# Patient Record
Sex: Female | Born: 1935
Health system: Southern US, Community
[De-identification: ages and names within clinical notes are randomized; demographics above are authoritative.]

## PROBLEM LIST (undated history)

## (undated) DIAGNOSIS — E785 Hyperlipidemia, unspecified: Secondary | ICD-10-CM

## (undated) DIAGNOSIS — J302 Other seasonal allergic rhinitis: Secondary | ICD-10-CM

## (undated) DIAGNOSIS — I4891 Unspecified atrial fibrillation: Secondary | ICD-10-CM

## (undated) DIAGNOSIS — G4700 Insomnia, unspecified: Secondary | ICD-10-CM

## (undated) DIAGNOSIS — E039 Hypothyroidism, unspecified: Secondary | ICD-10-CM

## (undated) DIAGNOSIS — I10 Essential (primary) hypertension: Secondary | ICD-10-CM

---

## 2012-02-28 DIAGNOSIS — J309 Allergic rhinitis, unspecified: Secondary | ICD-10-CM | POA: Diagnosis not present

## 2012-02-28 DIAGNOSIS — Z79899 Other long term (current) drug therapy: Secondary | ICD-10-CM | POA: Diagnosis not present

## 2012-02-28 DIAGNOSIS — E039 Hypothyroidism, unspecified: Secondary | ICD-10-CM | POA: Diagnosis not present

## 2012-02-28 DIAGNOSIS — I4891 Unspecified atrial fibrillation: Secondary | ICD-10-CM | POA: Diagnosis not present

## 2012-08-07 DIAGNOSIS — Z23 Encounter for immunization: Secondary | ICD-10-CM | POA: Diagnosis not present

## 2012-09-02 DIAGNOSIS — H26499 Other secondary cataract, unspecified eye: Secondary | ICD-10-CM | POA: Diagnosis not present

## 2012-09-09 DIAGNOSIS — I10 Essential (primary) hypertension: Secondary | ICD-10-CM | POA: Diagnosis not present

## 2012-09-09 DIAGNOSIS — I4891 Unspecified atrial fibrillation: Secondary | ICD-10-CM | POA: Diagnosis not present

## 2012-09-09 DIAGNOSIS — E782 Mixed hyperlipidemia: Secondary | ICD-10-CM | POA: Diagnosis not present

## 2012-09-09 DIAGNOSIS — Z79899 Other long term (current) drug therapy: Secondary | ICD-10-CM | POA: Diagnosis not present

## 2012-10-07 DIAGNOSIS — I10 Essential (primary) hypertension: Secondary | ICD-10-CM | POA: Diagnosis not present

## 2012-11-07 DIAGNOSIS — I10 Essential (primary) hypertension: Secondary | ICD-10-CM | POA: Diagnosis not present

## 2013-03-03 DIAGNOSIS — E782 Mixed hyperlipidemia: Secondary | ICD-10-CM | POA: Diagnosis not present

## 2013-03-03 DIAGNOSIS — Z0000 Encounter for general adult medical examination without abnormal findings: Secondary | ICD-10-CM | POA: Diagnosis not present

## 2013-03-03 DIAGNOSIS — E7800 Pure hypercholesterolemia, unspecified: Secondary | ICD-10-CM | POA: Diagnosis not present

## 2013-08-17 DIAGNOSIS — Z23 Encounter for immunization: Secondary | ICD-10-CM | POA: Diagnosis not present

## 2013-09-15 DIAGNOSIS — E039 Hypothyroidism, unspecified: Secondary | ICD-10-CM | POA: Diagnosis not present

## 2013-09-15 DIAGNOSIS — G4700 Insomnia, unspecified: Secondary | ICD-10-CM | POA: Diagnosis not present

## 2013-12-30 DIAGNOSIS — H524 Presbyopia: Secondary | ICD-10-CM | POA: Diagnosis not present

## 2013-12-30 DIAGNOSIS — H26499 Other secondary cataract, unspecified eye: Secondary | ICD-10-CM | POA: Diagnosis not present

## 2014-02-09 DIAGNOSIS — I4891 Unspecified atrial fibrillation: Secondary | ICD-10-CM | POA: Diagnosis not present

## 2014-02-09 DIAGNOSIS — E039 Hypothyroidism, unspecified: Secondary | ICD-10-CM | POA: Diagnosis not present

## 2014-02-09 DIAGNOSIS — E782 Mixed hyperlipidemia: Secondary | ICD-10-CM | POA: Diagnosis not present

## 2014-02-09 DIAGNOSIS — G4700 Insomnia, unspecified: Secondary | ICD-10-CM | POA: Diagnosis not present

## 2014-03-11 DIAGNOSIS — I491 Atrial premature depolarization: Secondary | ICD-10-CM | POA: Diagnosis not present

## 2014-03-11 DIAGNOSIS — I471 Supraventricular tachycardia: Secondary | ICD-10-CM | POA: Diagnosis not present

## 2014-03-11 DIAGNOSIS — I452 Bifascicular block: Secondary | ICD-10-CM | POA: Diagnosis not present

## 2014-03-12 DIAGNOSIS — Z0000 Encounter for general adult medical examination without abnormal findings: Secondary | ICD-10-CM | POA: Diagnosis not present

## 2014-03-12 DIAGNOSIS — I10 Essential (primary) hypertension: Secondary | ICD-10-CM | POA: Diagnosis not present

## 2014-04-06 DIAGNOSIS — I4891 Unspecified atrial fibrillation: Secondary | ICD-10-CM | POA: Diagnosis not present

## 2014-04-06 DIAGNOSIS — R55 Syncope and collapse: Secondary | ICD-10-CM | POA: Diagnosis not present

## 2014-04-06 DIAGNOSIS — I471 Supraventricular tachycardia: Secondary | ICD-10-CM | POA: Diagnosis not present

## 2014-04-09 DIAGNOSIS — R55 Syncope and collapse: Secondary | ICD-10-CM | POA: Diagnosis not present

## 2014-04-15 DIAGNOSIS — I471 Supraventricular tachycardia: Secondary | ICD-10-CM | POA: Diagnosis not present

## 2014-04-15 DIAGNOSIS — I4891 Unspecified atrial fibrillation: Secondary | ICD-10-CM | POA: Diagnosis not present

## 2014-04-22 DIAGNOSIS — I4891 Unspecified atrial fibrillation: Secondary | ICD-10-CM | POA: Diagnosis not present

## 2014-06-15 DIAGNOSIS — Z79899 Other long term (current) drug therapy: Secondary | ICD-10-CM | POA: Diagnosis not present

## 2014-06-15 DIAGNOSIS — E039 Hypothyroidism, unspecified: Secondary | ICD-10-CM | POA: Diagnosis not present

## 2014-06-15 DIAGNOSIS — I10 Essential (primary) hypertension: Secondary | ICD-10-CM | POA: Diagnosis not present

## 2014-06-15 DIAGNOSIS — E782 Mixed hyperlipidemia: Secondary | ICD-10-CM | POA: Diagnosis not present

## 2014-06-15 DIAGNOSIS — I4891 Unspecified atrial fibrillation: Secondary | ICD-10-CM | POA: Diagnosis not present

## 2014-08-25 DIAGNOSIS — Z23 Encounter for immunization: Secondary | ICD-10-CM | POA: Diagnosis not present

## 2014-10-18 DIAGNOSIS — R42 Dizziness and giddiness: Secondary | ICD-10-CM | POA: Diagnosis not present

## 2015-01-04 DIAGNOSIS — H04123 Dry eye syndrome of bilateral lacrimal glands: Secondary | ICD-10-CM | POA: Diagnosis not present

## 2015-01-04 DIAGNOSIS — H26493 Other secondary cataract, bilateral: Secondary | ICD-10-CM | POA: Diagnosis not present

## 2015-01-19 DIAGNOSIS — Z79899 Other long term (current) drug therapy: Secondary | ICD-10-CM | POA: Diagnosis not present

## 2015-01-19 DIAGNOSIS — Z1389 Encounter for screening for other disorder: Secondary | ICD-10-CM | POA: Diagnosis not present

## 2015-01-19 DIAGNOSIS — I10 Essential (primary) hypertension: Secondary | ICD-10-CM | POA: Diagnosis not present

## 2015-01-19 DIAGNOSIS — E039 Hypothyroidism, unspecified: Secondary | ICD-10-CM | POA: Diagnosis not present

## 2015-01-19 DIAGNOSIS — E782 Mixed hyperlipidemia: Secondary | ICD-10-CM | POA: Diagnosis not present

## 2015-04-20 DIAGNOSIS — Z79899 Other long term (current) drug therapy: Secondary | ICD-10-CM | POA: Diagnosis not present

## 2015-04-20 DIAGNOSIS — E039 Hypothyroidism, unspecified: Secondary | ICD-10-CM | POA: Diagnosis not present

## 2015-04-20 DIAGNOSIS — I10 Essential (primary) hypertension: Secondary | ICD-10-CM | POA: Diagnosis not present

## 2015-04-20 DIAGNOSIS — Z0000 Encounter for general adult medical examination without abnormal findings: Secondary | ICD-10-CM | POA: Diagnosis not present

## 2015-06-09 DIAGNOSIS — L259 Unspecified contact dermatitis, unspecified cause: Secondary | ICD-10-CM | POA: Diagnosis not present

## 2015-08-22 DIAGNOSIS — I10 Essential (primary) hypertension: Secondary | ICD-10-CM | POA: Diagnosis not present

## 2015-08-22 DIAGNOSIS — Z1389 Encounter for screening for other disorder: Secondary | ICD-10-CM | POA: Diagnosis not present

## 2015-08-22 DIAGNOSIS — E039 Hypothyroidism, unspecified: Secondary | ICD-10-CM | POA: Diagnosis not present

## 2015-08-22 DIAGNOSIS — Z79899 Other long term (current) drug therapy: Secondary | ICD-10-CM | POA: Diagnosis not present

## 2015-08-22 DIAGNOSIS — Z23 Encounter for immunization: Secondary | ICD-10-CM | POA: Diagnosis not present

## 2015-11-16 ENCOUNTER — Inpatient Hospital Stay (HOSPITAL_COMMUNITY): Payer: Medicare Other

## 2015-11-16 ENCOUNTER — Encounter (HOSPITAL_COMMUNITY): Payer: Self-pay | Admitting: Family Medicine

## 2015-11-16 ENCOUNTER — Inpatient Hospital Stay (HOSPITAL_COMMUNITY)
Admission: EM | Admit: 2015-11-16 | Discharge: 2015-11-18 | DRG: 378 | Disposition: A | Discharge status: Home Health | Payer: Medicare Other | Source: Other Acute Inpatient Hospital | Attending: Internal Medicine | Admitting: Internal Medicine

## 2015-11-16 DIAGNOSIS — K922 Gastrointestinal hemorrhage, unspecified: Secondary | ICD-10-CM | POA: Diagnosis not present

## 2015-11-16 DIAGNOSIS — Z79899 Other long term (current) drug therapy: Secondary | ICD-10-CM

## 2015-11-16 DIAGNOSIS — K921 Melena: Secondary | ICD-10-CM | POA: Diagnosis not present

## 2015-11-16 DIAGNOSIS — E785 Hyperlipidemia, unspecified: Secondary | ICD-10-CM | POA: Diagnosis not present

## 2015-11-16 DIAGNOSIS — K2971 Gastritis, unspecified, with bleeding: Secondary | ICD-10-CM

## 2015-11-16 DIAGNOSIS — G4700 Insomnia, unspecified: Secondary | ICD-10-CM | POA: Diagnosis present

## 2015-11-16 DIAGNOSIS — Z7982 Long term (current) use of aspirin: Secondary | ICD-10-CM | POA: Diagnosis not present

## 2015-11-16 DIAGNOSIS — D62 Acute posthemorrhagic anemia: Secondary | ICD-10-CM | POA: Diagnosis present

## 2015-11-16 DIAGNOSIS — I10 Essential (primary) hypertension: Secondary | ICD-10-CM | POA: Diagnosis present

## 2015-11-16 DIAGNOSIS — E039 Hypothyroidism, unspecified: Secondary | ICD-10-CM | POA: Diagnosis present

## 2015-11-16 DIAGNOSIS — I4891 Unspecified atrial fibrillation: Secondary | ICD-10-CM | POA: Diagnosis not present

## 2015-11-16 DIAGNOSIS — I482 Chronic atrial fibrillation: Secondary | ICD-10-CM | POA: Diagnosis not present

## 2015-11-16 DIAGNOSIS — K219 Gastro-esophageal reflux disease without esophagitis: Secondary | ICD-10-CM | POA: Diagnosis present

## 2015-11-16 DIAGNOSIS — D500 Iron deficiency anemia secondary to blood loss (chronic): Secondary | ICD-10-CM | POA: Diagnosis not present

## 2015-11-16 DIAGNOSIS — K625 Hemorrhage of anus and rectum: Secondary | ICD-10-CM | POA: Diagnosis not present

## 2015-11-16 DIAGNOSIS — O10019 Pre-existing essential hypertension complicating pregnancy, unspecified trimester: Secondary | ICD-10-CM | POA: Diagnosis present

## 2015-11-16 DIAGNOSIS — K5791 Diverticulosis of intestine, part unspecified, without perforation or abscess with bleeding: Principal | ICD-10-CM | POA: Diagnosis present

## 2015-11-16 HISTORY — DX: Unspecified atrial fibrillation: I48.91

## 2015-11-16 HISTORY — DX: Hypothyroidism, unspecified: E03.9

## 2015-11-16 HISTORY — DX: Hyperlipidemia, unspecified: E78.5

## 2015-11-16 HISTORY — DX: Essential (primary) hypertension: I10

## 2015-11-16 HISTORY — DX: Other seasonal allergic rhinitis: J30.2

## 2015-11-16 HISTORY — DX: Insomnia, unspecified: G47.00

## 2015-11-16 LAB — CBC
HCT: 39.4 % (ref 36.0–46.0)
Hemoglobin: 12.7 g/dL (ref 12.0–15.0)
MCH: 30.5 pg (ref 26.0–34.0)
MCHC: 32.2 g/dL (ref 30.0–36.0)
MCV: 94.7 fL (ref 78.0–100.0)
Platelets: 417 10*3/uL — ABNORMAL HIGH (ref 150–400)
RBC: 4.16 MIL/uL (ref 3.87–5.11)
RDW: 14.5 % (ref 11.5–15.5)
WBC: 12.2 10*3/uL — ABNORMAL HIGH (ref 4.0–10.5)

## 2015-11-16 LAB — COMPREHENSIVE METABOLIC PANEL
ALT: 11 U/L — ABNORMAL LOW (ref 14–54)
ANION GAP: 11 (ref 5–15)
AST: 24 U/L (ref 15–41)
Albumin: 3.1 g/dL — ABNORMAL LOW (ref 3.5–5.0)
Alkaline Phosphatase: 49 U/L (ref 38–126)
BILIRUBIN TOTAL: 0.5 mg/dL (ref 0.3–1.2)
BUN: 19 mg/dL (ref 6–20)
CO2: 24 mmol/L (ref 22–32)
Calcium: 8.3 mg/dL — ABNORMAL LOW (ref 8.9–10.3)
Chloride: 107 mmol/L (ref 101–111)
Creatinine, Ser: 0.92 mg/dL (ref 0.44–1.00)
GFR calc Af Amer: >60 mL/min (ref >60)
GFR, EST NON AFRICAN AMERICAN: 53 mL/min — AB (ref >60)
Glucose, Bld: 155 mg/dL — ABNORMAL HIGH (ref 65–99)
POTASSIUM: 4 mmol/L (ref 3.5–5.1)
Sodium: 142 mmol/L (ref 135–145)
TOTAL PROTEIN: 5.6 g/dL — AB (ref 6.5–8.1)

## 2015-11-16 LAB — APTT: aPTT: 29 seconds (ref 24–37)

## 2015-11-16 LAB — MRSA PCR SCREENING: MRSA BY PCR: NEGATIVE

## 2015-11-16 LAB — MAGNESIUM: Magnesium: 1.9 mg/dL (ref 1.7–2.4)

## 2015-11-16 LAB — PROTIME-INR
INR: 1.08 (ref 0.00–1.49)
PROTHROMBIN TIME: 14.2 s (ref 11.6–15.2)

## 2015-11-16 LAB — OCCULT BLOOD X 1 CARD TO LAB, STOOL: FECAL OCCULT BLD: POSITIVE — AB

## 2015-11-16 LAB — PHOSPHORUS: PHOSPHORUS: 2.9 mg/dL (ref 2.5–4.6)

## 2015-11-16 MED ORDER — LEVOTHYROXINE SODIUM 88 MCG PO TABS
88.0000 ug | ORAL_TABLET | Freq: Every day | ORAL | Status: DC
  Administered 2015-11-16 – 2015-11-18 (×3): 88 ug via ORAL
  Filled 2015-11-16 (×3): qty 1

## 2015-11-16 MED ORDER — POLYETHYLENE GLYCOL 3350 17 G PO PACK: 17.0000 g | PACK | Freq: Every day | ORAL | Status: DC | PRN

## 2015-11-16 MED ORDER — ACETAMINOPHEN 325 MG PO TABS
650.0000 mg | ORAL_TABLET | Freq: Four times a day (QID) | ORAL | Status: DC | PRN
  Administered 2015-11-16: 650 mg via ORAL
  Filled 2015-11-16: qty 2

## 2015-11-16 MED ORDER — PANTOPRAZOLE SODIUM 40 MG IV SOLR
40.0000 mg | Freq: Two times a day (BID) | INTRAVENOUS | Status: DC
  Administered 2015-11-16 – 2015-11-18 (×5): 40 mg via INTRAVENOUS
  Filled 2015-11-16 (×5): qty 40

## 2015-11-16 MED ORDER — ONDANSETRON HCL 4 MG/2ML IJ SOLN: 4.0000 mg | Freq: Four times a day (QID) | INTRAMUSCULAR | Status: DC | PRN

## 2015-11-16 MED ORDER — ONDANSETRON HCL 4 MG PO TABS: 4.0000 mg | ORAL_TABLET | Freq: Four times a day (QID) | ORAL | Status: DC | PRN

## 2015-11-16 MED ORDER — ZOLPIDEM TARTRATE 5 MG PO TABS
5.0000 mg | ORAL_TABLET | Freq: Every evening | ORAL | Status: DC | PRN
  Administered 2015-11-16 – 2015-11-17 (×2): 5 mg via ORAL
  Filled 2015-11-16 (×2): qty 1

## 2015-11-16 MED ORDER — SODIUM CHLORIDE 0.9 % IV SOLN
INTRAVENOUS | Status: DC
  Administered 2015-11-16 – 2015-11-17 (×2): via INTRAVENOUS

## 2015-11-16 MED ORDER — ACETAMINOPHEN 650 MG RE SUPP: 650.0000 mg | Freq: Four times a day (QID) | RECTAL | Status: DC | PRN

## 2015-11-16 MED ORDER — HYDRALAZINE HCL 20 MG/ML IJ SOLN: 5.0000 mg | INTRAMUSCULAR | Status: DC | PRN

## 2015-11-16 MED ORDER — PRAVASTATIN SODIUM 10 MG PO TABS
20.0000 mg | ORAL_TABLET | Freq: Every day | ORAL | Status: DC
  Administered 2015-11-16 – 2015-11-17 (×2): 20 mg via ORAL
  Filled 2015-11-16 (×2): qty 1

## 2015-11-16 MED ORDER — HYDROCHLOROTHIAZIDE 25 MG PO TABS
12.5000 mg | ORAL_TABLET | Freq: Every day | ORAL | Status: DC
  Filled 2015-11-16: qty 0.5

## 2015-11-16 MED ORDER — HYDROCHLOROTHIAZIDE 12.5 MG PO CAPS
12.5000 mg | ORAL_CAPSULE | Freq: Every day | ORAL | Status: DC
  Administered 2015-11-16 – 2015-11-18 (×3): 12.5 mg via ORAL
  Filled 2015-11-16 (×4): qty 1

## 2015-11-16 MED ORDER — TECHNETIUM TC 99M-LABELED RED BLOOD CELLS IV KIT
25.0000 | PACK | Freq: Once | INTRAVENOUS | Status: AC | PRN
  Administered 2015-11-16: 25 via INTRAVENOUS

## 2015-11-16 MED ORDER — SODIUM CHLORIDE 0.9 % IJ SOLN
3.0000 mL | Freq: Two times a day (BID) | INTRAMUSCULAR | Status: DC
  Administered 2015-11-16 – 2015-11-18 (×5): 3 mL via INTRAVENOUS

## 2015-11-16 MED ORDER — METOPROLOL TARTRATE 50 MG PO TABS
50.0000 mg | ORAL_TABLET | Freq: Two times a day (BID) | ORAL | Status: DC
  Administered 2015-11-16 – 2015-11-18 (×5): 50 mg via ORAL
  Filled 2015-11-16 (×5): qty 1

## 2015-11-16 NOTE — Consult Note (Signed)
Reason for Consult: GI bleeding Referring Physician: Hospital team  Mary Adams is an 81 y.o. female.  HPI: Patient with a negative GI history and her hospital computer chart was reviewed and her case discussed with her 2 daughters and she's not had any previous GI procedures and her bright red blood per rectum started at 1 AM and it has continued and she really doesn't have any pain and only has a little constipation and her family history is negative from a GI standpoint and she is on an aspirin a day and was initially tried on a different blood thinner a few years ago but was guaiac positive for a few stay workup and they changed her to aspirin and she has no other complaints  Past Medical History  Diagnosis Date  . Hypothyroidism   . Hypertension   . HLD (hyperlipidemia)   . Insomnia   . Seasonal allergies   . Atrial fibrillation (HCC)     ??? did not tolerate Eliquis due to GI bleed.    History reviewed. No pertinent past surgical history.  Family History  Problem Relation Age of Onset  . Congestive Heart Failure Mother   . Heart attack Father     Social History:  reports that she has never smoked. She does not have any smokeless tobacco history on file. She reports that she does not drink alcohol or use illicit drugs.  Allergies: No Known Allergies  Medications: I have reviewed the patient's current medications.  Results for orders placed or performed during the hospital encounter of 11/16/15 (from the past 48 hour(s))  MRSA PCR Screening     Status: None   Collection Time: 11/16/15  8:00 AM  Result Value Ref Range   MRSA by PCR NEGATIVE NEGATIVE    Comment:        The GeneXpert MRSA Assay (FDA approved for NASAL specimens only), is one component of a comprehensive MRSA colonization surveillance program. It is not intended to diagnose MRSA infection nor to guide or monitor treatment for MRSA infections.   CBC     Status: Abnormal   Collection Time: 11/16/15  11:25 AM  Result Value Ref Range   WBC 12.2 (H) 4.0 - 10.5 K/uL   RBC 4.16 3.87 - 5.11 MIL/uL   Hemoglobin 12.7 12.0 - 15.0 g/dL   HCT 78.239.4 95.636.0 - 21.346.0 %   MCV 94.7 78.0 - 100.0 fL   MCH 30.5 26.0 - 34.0 pg   MCHC 32.2 30.0 - 36.0 g/dL   RDW 08.614.5 57.811.5 - 46.915.5 %   Platelets 417 (H) 150 - 400 K/uL    No results found.  ROS negative except above Blood pressure 123/64, pulse 86, temperature 97.6 F (36.4 C), temperature source Oral, resp. rate 23, height 5\' 7"  (1.702 m), weight 71.6 kg (157 lb 13.6 oz), SpO2 97 %. Physical Exam Vital signs stable afebrile no acute distress lungs are clear regular rate and rhythm abdomen is soft nontender good bowel sounds labs reviewed Assessment/Plan: Probable diverticular bleeding Plan: We'll proceed with nuclear bleeding scan and consider angiogram if positive otherwise okay with clear liquids for now and hold aspirin and will follow with you and discussed possible endoscopic studies versus even surgical options if needed but will wait on the above  [facility],Elishah Ashmore E 11/16/2015, 12:09 PM

## 2015-11-16 NOTE — H&P (Signed)
[facility] History and Physical  Mary Adams ZOX:[id number] DOB: 1922-10-10 DOA: 11/16/2015  Referring physician: Dr Earl Gala [facility] PCP: No primary care provider on file.   Chief Complaint: GI bleed  HPI: Mary Adams is a 81 y.o. female  Patient received enemas, hospital after transfer from the emergency room at [facility] where she was treated for rectal bleeding. When of hospitals without any GI coverage at the time which is why patient was transferred [facility].   History provided by patient and daughter. Problem is intermittent but getting worse. Patient states that she awoke at approximately 01:00  due to the urge to have a bowel movement. When she stooled in the toilet she had a small amount of loose stool but a large volume of blood. Patient then proceeded over the next several hours to have 3 more large volume bloody bowel movements associated with lower abdominal crampy sensation. Patient was taken to [facility] where the patient had 2 additional bloody bowel movements witnessed by EDP. Last bowel movement at 06:30. At baseline patient has daily soft bowel movements and does not take laxatives. Patient's current medications include aspirin, pravastatin, hydrochlorothiazide, metoprolol, Synthroid, Ambien. Denies any NSAID use out of baby aspirin. Patient has a remote history of GI bleed secondary to being placed on a request for atrial fibrillation several years ago. This was subsequently stopped. Patient has never had a colonoscopy. There is no family history of colon cancer. Patient does not have a history of GERD and does not use an H2 blocker or PPI.  Denies loss of appetite, nausea, vomiting, constipation, diaphoresis, chest pain, shortness breath, palpitations, fevers, dysuria, back pain, unintentional wt loss.     Currently on ASA, pravastatin 20, H +CTZ 25, Metop 25 BID, Levo 88, AMbien 10   Review of Systems:  Constitutional:  No weight  loss, night sweats, Fevers, chills, fatigue.  HEENT:  No headaches, Difficulty swallowing,Tooth/dental problems,Sore throat, Cardio-vascular:  No chest pain, Orthopnea, PND, swelling in lower extremities, anasarca, dizziness, palpitations  GI: Per HPI, nml appetite Resp:   No shortness of breath with exertion or at rest. No excess mucus, no productive cough, No non-productive cough, No coughing up of blood.No change in color of mucus.No wheezing.No chest wall deformity  Skin:  no rash or lesions.  GU:  no dysuria, change in color of urine, no urgency or frequency. No flank pain.  Musculoskeletal:   No joint pain or swelling. No decreased range of motion. No back pain.  Psych:  No change in mood or affect. No depression or anxiety. No memory loss.  Neuro:  No change in sensation, unilateral strength, or cognitive abilities  All other systems were reviewed and are negative.  Past Medical History  Diagnosis Date  . Hypothyroidism   . Hypertension   . HLD (hyperlipidemia)   . Insomnia   . Seasonal allergies   . Atrial fibrillation (HCC)     ??? did not tolerate Eliquis due to GI bleed.   No past surgical history on file. Social History:  reports that she has never smoked. She does not have any smokeless tobacco history on file. She reports that she does not drink alcohol or use illicit drugs.  No Known Allergies  Family History  Problem Relation Age of Onset  . Congestive Heart Failure Mother   . Heart attack Father      Prior to Admission medications   Not on File   Physical Exam: Filed Vitals:   11/16/15  0730 11/16/15 0845  BP:  151/77  Pulse: 89   Temp:  97.6 F (36.4 C)  TempSrc:  Oral  Resp: 23   SpO2: 96% 97%    Wt Readings from Last 3 Encounters:  No data found for Wt    General:  Appears calm and comfortable Eyes:  PERRL, EOMI, normal lids, iris ENT:  grossly normal hearing, lips & tongue Neck:  no LAD, masses or thyromegaly Cardiovascular:   irregular, no m/r/g. No LE edema, no carotid bruits Respiratory:  CTA bilaterally, no w/r/r. Normal respiratory effort. Abdomen:  soft, ntnd Skin:  no rash or induration seen on limited exam Musculoskeletal:  grossly normal tone BUE/BLE Psychiatric:  grossly normal mood and affect, speech fluent and appropriate Neurologic:  CN 2-12 grossly intact, moves all extremities in coordinated fashion.          Labs on Admission:  Basic Metabolic Panel: No results for input(s): NA, K, CL, CO2, GLUCOSE, BUN, CREATININE, CALCIUM, MG, PHOS in the last 168 hours. Liver Function Tests: No results for input(s): AST, ALT, ALKPHOS, BILITOT, PROT, ALBUMIN in the last 168 hours. No results for input(s): LIPASE, AMYLASE in the last 168 hours. No results for input(s): AMMONIA in the last 168 hours. CBC: No results for input(s): WBC, NEUTROABS, HGB, HCT, MCV, PLT in the last 168 hours. Cardiac Enzymes: No results for input(s): CKTOTAL, CKMB, CKMBINDEX, TROPONINI in the last 168 hours.  BNP (last 3 results) No results for input(s): BNP in the last 8760 hours.  ProBNP (last 3 results) No results for input(s): PROBNP in the last 8760 hours.   Creatinine clearance cannot be calculated (Unknown ideal weight.)  CBG: No results for input(s): GLUCAP in the last 168 hours.  Radiological Exams on Admission: No results found.   review of transfer notes show hemoglobin 13.9, INR 1.0, blood type AB positive, glucose 136. All other labs normal. Patient was given IV Protonix 401. EKG showed right bundle branch block. Patient was afebrile vital signs were stable with a blood pressure 188/90.   Assessment/Plan Principal Problem:   Lower GI bleed Active Problems:   A-fib (HCC)   Hypothyroidism   HLD (hyperlipidemia)   Benign essential hypertension antepartum  81yo F w/ h/o Afib??, HTN, HLD, hypothyroidism, insomnia presenting w/ GI bleed.   GI bleed: Suspect diverticular bleed. No history of colonoscopy.  Unlikely upper GI based on patient's description and lack of NSAID use. History of GI bleed on Eliquis several years ago. Doubt infectious etiology. Discussed case w/ [facility] and greatly appreciate his assistance.  - tele, Obs - clear liquid diet - CBC, CMET, Coags - f/u GI recs (unlikely to scope unless pt worsens) - protonix  HTN: elevated. Likely from not taking morning medications - continue Metop, HCTZ - hydralazine PRN  HLD: - continue statin  Hypothyroidism: - continue synthroid  Irregular heart beat: pt unsure of actual Dx, suspect Afib given h/o Eliquis use. Stopped anticoagulation due to GI bleed.  - EKG - hold ASA due to GI bleed - Tele as above  Insomnia: - continue ambien   Code Status: FULL   DVT Prophylaxis: SCD Family Communication: Daughter Disposition Plan: Pending Improvement    Almadelia Looman Shela CommonsJ, MD Family Medicine [facility] [email or web address] Password [email or web address]

## 2015-11-16 NOTE — Progress Notes (Signed)
Patient had 2 bloody stools this shift.  Complaint of hyperacidity verbalized and explained to patient and family that MD started her on Protonix IV to help this hyperacidity.  Patient is very hard of hearing.

## 2015-11-16 NOTE — Plan of Care (Signed)
Called by [facility] Dr. Earl Galasborne with regarding patient Mary Adams, 81 year old female who presented with rectal bleeding. Patient had 2 episodes of large amount of frank rectal bleeding in the ER. ER physician states patient is hemodynamically stable. Hemoglobin last one was around 13. Patient takes aspirin but otherwise not on any anticoagulants. Past medical history of hypertension. Since there is [facility]  [name].

## 2015-11-17 DIAGNOSIS — K922 Gastrointestinal hemorrhage, unspecified: Secondary | ICD-10-CM | POA: Diagnosis present

## 2015-11-17 DIAGNOSIS — K2971 Gastritis, unspecified, with bleeding: Secondary | ICD-10-CM

## 2015-11-17 DIAGNOSIS — I482 Chronic atrial fibrillation: Secondary | ICD-10-CM

## 2015-11-17 LAB — CBC
HEMATOCRIT: 33.1 % — AB (ref 36.0–46.0)
HEMOGLOBIN: 10.5 g/dL — AB (ref 12.0–15.0)
MCH: 30.1 pg (ref 26.0–34.0)
MCHC: 31.7 g/dL (ref 30.0–36.0)
MCV: 94.8 fL (ref 78.0–100.0)
Platelets: 359 10*3/uL (ref 150–400)
RBC: 3.49 MIL/uL — AB (ref 3.87–5.11)
RDW: 14.7 % (ref 11.5–15.5)
WBC: 8.3 10*3/uL (ref 4.0–10.5)

## 2015-11-17 NOTE — Progress Notes (Addendum)
Mary ReddenEvelyn Adams 8:43 AM  Subjective: Patient doing well without any new complaints and no bowel movement since 7 PM which was maroon as well and her nuclear medicine scan was discussed with the family and we discussed her aspirin use going forward  Objective: Vital signs stable afebrile no acute distress abdomen is soft nontender hemoglobin decreased some nuclear scan negative Assessment: Presumed diverticular bleeding  Plan: Clear liquids today and if signs of rebleeding ask nuclear medicine for a delayed scan reading which can be done within 24 hours from the time she was injected and if no signs of bleeding may advance diet tomorrow and you will need to determine aspirin needs going forward and use as low a dose as possible and will check on tomorrow and I discussed with the family if she has no further bleeding holding further workup for now however if this becomes a recurrent problem she would  need a workup and they are in agreement  [facility] Adams  Pager [phone] After 5PM or if no answer call [phone]

## 2015-11-17 NOTE — Progress Notes (Signed)
Utilization Review Completed.  

## 2015-11-17 NOTE — Progress Notes (Signed)
[facility] PROGRESS NOTE  Mary Adams QQV:[id number] DOB: 06-21-1922 DOA: 11/16/2015 PCP: No primary care provider on file.  Assessment/Plan: #1 lower GI bleed/probable diverticular bleed Patient seems to have a slowing down of GI bleed. Patient is afebrile. Patient is hemodynamically stable. Hemoglobin currently at 10.5 from 12.7 on admission. Nuclear medicine bleeding scan done on 11/16/2015 with no source located. Patient has been seen by gastroenterology and recommended to start patient on clear liquid diet today and if patient shows any signs of rebleeding risk as nuclear medicine for delayed scan reading. GI following and appreciate input and recommendations.  #2 acute blood loss anemia Secondary to problem #1. Check an anemia panel. Follow.  #3 hypertension Continue home regimen of Lopressor HCTZ, hydralazine as needed.  #4 hyperlipidemia Continue statin.  #5 hypothyroidism Continue Synthroid.  #6 Irregular heartbeat/Afib Continue to follow. Anticoagulation on hold secondary to problem #1. Aspirin on hold.  #7 prophylaxis PPI for GI prophylaxis. SCDs for DVT prophylaxis.  Code Status: Full Family Communication: Updated patient and daughter at bedside. [facility]    Consultants:  Dr. Ewing SchleinMagod 11/16/2015  Procedures:  Nuclear medicine bleeding scan 11/16/2015  Antibiotics:  None  HPI/Subjective: Patient feels bleeding has slowed down. Patient noted to have bloody bowel movements last night. No shortness of breath. No chest pain. No abdominal pain.  Objective: Filed Vitals:   11/17/15 0927 11/17/15 1203  BP: 142/61 136/55  Pulse: 80 65  Temp:  97.9 F (36.6 C)  Resp:  21    Intake/Output Summary (Last 24 hours) at 11/17/15 1326 Last data filed at 11/17/15 [id number]  Gross per 24 hour  Intake   1566 ml  Output    100 ml  Net   1466 ml   Filed Weights   11/16/15 0845  Weight: 71.6 kg (157 lb 13.6 oz)     Exam:   General:  NAD  Cardiovascular: RRR  Respiratory: CTAB  Abdomen: Obese, soft, nontender, nondistended, positive bowel sounds.   Musculoskeletal: No clubbing cyanosis or edema.   Data Reviewed: Basic Metabolic Panel:  Recent Labs Lab 11/16/15 1125  NA 142  K 4.0  CL 107  CO2 24  GLUCOSE 155*  BUN 19  CREATININE 0.92  CALCIUM 8.3*  MG 1.9  PHOS 2.9   Liver Function Tests:  Recent Labs Lab 11/16/15 1125  AST 24  ALT 11*  ALKPHOS 49  BILITOT 0.5  PROT 5.6*  ALBUMIN 3.1*   No results for input(s): LIPASE, AMYLASE in the last 168 hours. No results for input(s): AMMONIA in the last 168 hours. CBC:  Recent Labs Lab 11/16/15 1125 11/17/15 0302  WBC 12.2* 8.3  HGB 12.7 10.5*  HCT 39.4 33.1*  MCV 94.7 94.8  PLT 417* 359   Cardiac Enzymes: No results for input(s): CKTOTAL, CKMB, CKMBINDEX, TROPONINI in the last 168 hours. BNP (last 3 results) No results for input(s): BNP in the last 8760 hours.  ProBNP (last 3 results) No results for input(s): PROBNP in the last 8760 hours.  CBG: No results for input(s): GLUCAP in the last 168 hours.  Recent Results (from the past 240 hour(s))  MRSA PCR Screening     Status: None   Collection Time: 11/16/15  8:00 AM  Result Value Ref Range Status   MRSA by PCR NEGATIVE NEGATIVE Final    Comment:        The GeneXpert MRSA Assay (FDA approved for NASAL specimens only), is one component of a comprehensive MRSA  colonization surveillance program. It is not intended to diagnose MRSA infection nor to guide or monitor treatment for MRSA infections.      Studies: Nm Gi Blood Loss  11/16/2015  CLINICAL DATA:  GI bleed this morning. EXAM: NUCLEAR MEDICINE GASTROINTESTINAL BLEEDING SCAN TECHNIQUE: Sequential abdominal images were obtained following intravenous administration of Tc-65m labeled red blood cells. RADIOPHARMACEUTICALS:  26.0 mCi Tc-82m in-vitro labeled red cells. COMPARISON:  None. FINDINGS:  Imaging was carried out for 2 hours. No radiotracer accumulation to localize the GI bleed. IMPRESSION: No visible radiotracer accumulation to localize the GI bleed. Electronically Signed   By: Charlett Nose M.D.   On: 11/16/2015 16:02    Scheduled Meds: . hydrochlorothiazide  12.5 mg Oral Daily  . levothyroxine  88 mcg Oral QAC breakfast  . metoprolol  50 mg Oral BID  . pantoprazole (PROTONIX) IV  40 mg Intravenous Q12H  . pravastatin  20 mg Oral q1800  . sodium chloride  3 mL Intravenous Q12H   Continuous Infusions: . sodium chloride 75 mL/hr at 11/17/15 1610    Principal Problem:   Lower GI bleed Active Problems:   A-fib (HCC)   Hypothyroidism   HLD (hyperlipidemia)   Benign essential hypertension antepartum   GI bleed    Time spent: 35 minutes    Theresia Pree M.D. [facility] Pager [phone]. If 7PM-7AM, please contact night-coverage at [email or web address], password [facility] 11/17/2015, 1:26 PM  LOS: 1 day

## 2015-11-18 LAB — BASIC METABOLIC PANEL
ANION GAP: 6 (ref 5–15)
BUN: 8 mg/dL (ref 6–20)
CALCIUM: 8.4 mg/dL — AB (ref 8.9–10.3)
CO2: 30 mmol/L (ref 22–32)
Chloride: 103 mmol/L (ref 101–111)
Creatinine, Ser: 0.82 mg/dL (ref 0.44–1.00)
Glucose, Bld: 111 mg/dL — ABNORMAL HIGH (ref 65–99)
Potassium: 3.2 mmol/L — ABNORMAL LOW (ref 3.5–5.1)
SODIUM: 139 mmol/L (ref 135–145)

## 2015-11-18 LAB — CBC
HEMATOCRIT: 35.3 % — AB (ref 36.0–46.0)
Hemoglobin: 11.2 g/dL — ABNORMAL LOW (ref 12.0–15.0)
MCH: 30.1 pg (ref 26.0–34.0)
MCHC: 31.7 g/dL (ref 30.0–36.0)
MCV: 94.9 fL (ref 78.0–100.0)
Platelets: 417 10*3/uL — ABNORMAL HIGH (ref 150–400)
RBC: 3.72 MIL/uL — ABNORMAL LOW (ref 3.87–5.11)
RDW: 14.5 % (ref 11.5–15.5)
WBC: 9 10*3/uL (ref 4.0–10.5)

## 2015-11-18 MED ORDER — POTASSIUM CHLORIDE CRYS ER 20 MEQ PO TBCR
40.0000 meq | EXTENDED_RELEASE_TABLET | Freq: Once | ORAL | Status: AC
Start: 2015-11-18 — End: 2015-11-18
  Administered 2015-11-18: 40 meq via ORAL
  Filled 2015-11-18: qty 2

## 2015-11-18 MED ORDER — ESOMEPRAZOLE MAGNESIUM 40 MG PO CPDR: 40.0000 mg | DELAYED_RELEASE_CAPSULE | Freq: Every day | ORAL | Status: AC

## 2015-11-18 MED ORDER — ASPIRIN EC 81 MG PO TBEC
81.0000 mg | DELAYED_RELEASE_TABLET | Freq: Every day | ORAL | Status: AC
Start: 2015-12-03 — End: ?

## 2015-11-18 NOTE — Care Management Note (Addendum)
Case Management Note  Patient Details  Name: Mary Adams MRN: [id number] Date of Birth: Jul 10, 1922  Subjective/Objective:                    Action/Plan:  Confirmed face sheet information with patient's two daughters Mary Adams [phone], Mary Adams [phone]   [facility] phone [phone] fax [phone] Expected Discharge Date:                  Expected Discharge Plan:  Home w Home Health Services  In-House Referral:     Discharge planning Services  CM Consult  Post Acute Care Choice:  Home Health, Durable Medical Equipment Choice offered to:  Patient, Adult Children  DME Arranged:  [facility] rolling DME Agency:     HH Arranged:  PT HH Agency[facility]  [facility]  Status of Service:  Completed, signed off  Medicare Important Message Given:    Date Medicare IM Given:    Medicare IM give by:    Date Additional Medicare IM Given:    Additional Medicare Important Message give by:     If discussed at Long Length of Stay Meetings, dates discussed:    Additional Comments:  Kingsley PlanWile, Waverly Tarquinio Marie, RN 11/18/2015, 2:18 PM

## 2015-11-18 NOTE — Progress Notes (Signed)
AVS given to patient and both daughters.  IV removed. Belongings packed. Transportation with daughters.  Understanding of instructions verbalized.

## 2015-11-18 NOTE — Progress Notes (Signed)
Murrell ReddenEvelyn Cathy 12:08 PM  Subjective: Patient doing well without any obvious bleeding and wants to go home and has no new complaints and again her case was discussed with her daughters as well  Objective: Vital signs stable afebrile no acute distress abdomen is soft nontender hemoglobin increased  Assessment: Probable diverticular bleeding  Plan: Will advance diet to soft foods and please call us if we could be of any further assistance with this hospital stay and the family will take her home soon but they do not drive at night and you will need to decide the minimal aspirin or blood thinner she can be placed on and I would start it in a week or 2 and I'm happy to see back when necessary and if this is a recurrent problem will probably need workup in the future  [facility],Lakenzie Mcclafferty E  Pager [phone] After 5PM or if no answer call [phone]

## 2015-11-18 NOTE — Evaluation (Signed)
Physical Therapy Evaluation Patient Details Name: Mary Adams MRN: [id number] DOB: March 22, 1922 Today's Date: 11/18/2015   History of Present Illness  81 y.o. female admitted for lower GI bleed, HTN, and acute blood loss anemia.  Clinical Impression  Pt admitted with above diagnosis. Pt currently with functional limitations due to the deficits listed below (see PT Problem List). Demonstrates instability with gait, apparently beyond her typical function. Greatly improved with support from a rolling walker which she agrees to use at d/c. Fully independent at home prior to admission. Daughters present during evaluation and very supportive. Plan to provide 24/7 assist at d/c. [name] will benefit from skilled PT at home with HHPT to increase her independence and safety with mobility.       Follow Up Recommendations Home health PT;Supervision for mobility/OOB    Equipment Recommendations  Rolling walker with 5" wheels    Recommendations for Other Services       Precautions / Restrictions Precautions Precautions: Fall Restrictions Weight Bearing Restrictions: No      Mobility  Bed Mobility Overal bed mobility: Modified Independent             General bed mobility comments: extra time  Transfers Overall transfer level: Needs assistance Equipment used: None Transfers: Sit to/from Stand Sit to Stand: Supervision         General transfer comment: supervision for safety. Min sway upon standing, reaches for furniture for stability.  Ambulation/Gait Ambulation/Gait assistance: Supervision Ambulation Distance (Feet): 150 Feet Assistive device: Rolling walker (2 wheeled);None Gait Pattern/deviations: Step-through pattern;Decreased stride length;Staggering left;Narrow base of support Gait velocity: decreased Gait velocity interpretation: Below normal speed for age/gender General Gait Details: Took a short walk in room without assistive device, reaching for furniture  and PTs hand for support. Improved stability greatly with use of a rolling walker for remainder of distance. No overt loss of balance noted with this device. Educated on proper use and safety with RW.   Stairs Stairs: Yes Stairs assistance: Min assist Stair Management: One rail Right;Step to pattern;Forwards Number of Stairs: 2 General stair comments: ascend without assist, Min assist for hand held support with descent. Cues for sequencing without loss of balance. using single rail. Family reports they can safely assist pt at home with this task as needed.  Wheelchair Mobility    Modified Rankin (Stroke Patients Only)       Balance Overall balance assessment: Needs assistance Sitting-balance support: No upper extremity supported;Feet supported Sitting balance-Leahy Scale: Good     Standing balance support: No upper extremity supported Standing balance-Leahy Scale: Fair                               Pertinent Vitals/Pain Pain Assessment: No/denies pain    Home Living Family/patient expects to be discharged to:: Private residence Living Arrangements: Alone Available Help at Discharge: Family;Available 24 hours/day (daughters to stay with pt at d/c) Type of Home: House Home Access: Stairs to enter Entrance Stairs-Rails:  (columns) Entrance Stairs-Number of Steps: 2 Home Layout: One level Home Equipment: None Additional Comments: Both daughters present, plan to care for pt at d/c as long as needed    Prior Function Level of Independence: Independent         Comments: goes to church and grocery store. No falls in 13 years     Hand Dominance        Extremity/Trunk Assessment   Upper Extremity Assessment: Defer to OT  evaluation           Lower Extremity Assessment: Generalized weakness         Communication   Communication: HOH  Cognition Arousal/Alertness: Awake/alert Behavior During Therapy: WFL for tasks assessed/performed Overall  Cognitive Status: Within Functional Limits for tasks assessed                      General Comments General comments (skin integrity, edema, etc.): 94% SpO2 on room air. HR 74    Exercises        Assessment/Plan    PT Assessment Patient needs continued PT services  PT Diagnosis Difficulty walking;Abnormality of gait;Generalized weakness   PT Problem List Decreased strength;Decreased activity tolerance;Decreased mobility;Decreased balance;Decreased knowledge of use of DME  PT Treatment Interventions DME instruction;Gait training;Stair training;Functional mobility training;Therapeutic activities;Therapeutic exercise;Balance training;Neuromuscular re-education;Patient/family education;Modalities   PT Goals (Current goals can be found in the Care Plan section) Acute Rehab PT Goals Patient Stated Goal: Go home PT Goal Formulation: With patient/family Time For Goal Achievement: 12/02/15 Potential to Achieve Goals: Good    Frequency Min 3X/week   Barriers to discharge        Co-evaluation               End of Session   Activity Tolerance: Patient tolerated treatment well Patient left: in bed;with call bell/phone within reach;with family/visitor present Nurse Communication:  (could not reach via telephone)         Time: 1222-1238 PT Time Calculation (min) (ACUTE ONLY): 16 min   Charges:   PT Evaluation $Initial PT Evaluation Tier I: 1 Procedure     PT G CodesBerton Mount 11/18/2015, 1:33 PM Charlsie Merles, [place] [phone]

## 2015-11-20 NOTE — Discharge Summary (Signed)
[facility] Discharge Summary   Patient: Mary Adams    ZOX:[id number] PCP: Mary Ponto, MD    DOB: 1922-03-01 Date of admission: 11/16/2015   Date of discharge: 11/18/2015   Discharge Diagnoses:  Principal Problem:   Lower GI bleed Active Problems:   A-fib (HCC)   Hypothyroidism   HLD (hyperlipidemia)   Benign essential hypertension antepartum   GI bleed   Bleeding gastrointestinal  Recommendations for Outpatient Follow-up:  1. Follow up with PCP  For CBC recheck as well as discussion regarding continuation of aspirin. 2. Follow up with GI as needed for recurrent bleeding.  Diet recommendation: regular diet  Activity: The patient is advised to avoid heavy lifting until better.  Discharge Condition: good  History of present illness: As per the H and P dictated on admission, "Mary Adams is a 81 y.o. female  Patient received enemas, hospital after transfer from the emergency room at [facility] where she was treated for rectal bleeding. When of hospitals without any GI coverage at the time which is why patient was transferred [facility].   History provided by patient and daughter. Problem is intermittent but getting worse. Patient states that she awoke at approximately 01:00 due to the urge to have a bowel movement. When she stooled in the toilet she had a small amount of loose stool but a large volume of blood. Patient then proceeded over the next several hours to have 3 more large volume bloody bowel movements associated with lower abdominal crampy sensation. Patient was taken to [facility] where the patient had 2 additional bloody bowel movements witnessed by EDP. Last bowel movement at 06:30. At baseline patient has daily soft bowel movements and does not take laxatives. Patient's current medications include aspirin, pravastatin, hydrochlorothiazide, metoprolol, Synthroid, Ambien. Denies any NSAID use out of baby aspirin. Patient has a remote history of  GI bleed secondary to being placed on a request for atrial fibrillation several years ago. This was subsequently stopped. Patient has never had a colonoscopy. There is no family history of colon cancer. Patient does not have a history of GERD and does not use an H2 blocker or PPI.  Denies loss of appetite, nausea, vomiting, constipation, diaphoresis, chest pain, shortness breath, palpitations, fevers, dysuria, back pain, unintentional wt loss.  Currently on ASA, pravastatin 20, H +CTZ 25, Metop 25 BID, Levo 88, AMbien 10"  Hospital Course:  Summary of her active problems in the hospital is as following. 1. lower GI bleed/probable diverticular bleed Hemoglobin remained in the range of 10-11 from 12.7 on admission.  NM bleeding scan 11/16/2015 with no source located. Patient has been seen by gastroenterology, no procedure was planned since the patient was hemodynamically stable and her diet was advanced. GI recommended outpatient follow up as needed. Held her aspirin for 2 weeks and mentioned to the patient to discuss with PCP regarding further continuation.  Patient did complain of some GERD she was prescribed 2 weeks course of esomeprazole.  2. acute blood loss anemia Hemoglobin initially dropped but later remained stable.  3. hypertension Continue home regimen of Lopressor HCTZ.  4. hyperlipidemia Continue statin.  5. hypothyroidism Continue Synthroid.  6. Irregular heartbeat/Afib Aspirin on hold.  All other chronic medical condition were stable during the hospitalization.  Patient was seen by physical therapy, who recommended home health,  which was arranged by Child psychotherapist and case Production designer, theatre/television/film. On the day of the discharge the patient's hemoglobin remain stable, and no other acute medical condition were reported by  patient. the patient was felt safe to be discharge at home with home health.  Procedures and Results:  none   Consultations:  Gastroenterology Dr Mary Adams  Discharge  [facility]   11/16/15 0845  Weight: 71.6 kg (157 lb 13.6 oz)   Filed Vitals:   11/18/15 0626 11/18/15 1348  BP: 136/70 152/59  Pulse: 67 69  Temp: 97.9 F (36.6 C) 98.6 F (37 C)  Resp: 18 18   General: Appear in no distress, no Rash; Oral Mucosa moist. Cardiovascular: S1 and S2 Present, no Murmur, no JVD Respiratory: Bilateral Air entry present and Clear to Auscultation, no Crackles, no wheezes Abdomen: Bowel Sound present, Soft and no tenderness Extremities: no Pedal edema, no calf tenderness Neurology: Grossly no focal neuro deficit.  DISCHARGE MEDICATION: Discharge Instructions    Diet general    Complete by:  As directed      Increase activity slowly    Complete by:  As directed           Discharge Medication List as of 11/18/2015  2:25 PM    START taking these medications   Details  esomeprazole (NEXIUM) 40 MG capsule Take 1 capsule (40 mg total) by mouth daily at 12 noon., Starting 11/18/2015, Until Discontinued, Print      CONTINUE these medications which have CHANGED   Details  aspirin EC 81 MG tablet Take 1 tablet (81 mg total) by mouth daily. Start on 12/03/2015, Starting 12/03/2015, Until Discontinued, No Print      CONTINUE these medications which have NOT CHANGED   Details  cetirizine (ZYRTEC) 10 MG tablet Take 10 mg by mouth daily., Until Discontinued, Historical Med    hydrochlorothiazide (HYDRODIURIL) 25 MG tablet Take 12.5 mg by mouth daily., Until Discontinued, Historical Med    levothyroxine (SYNTHROID, LEVOTHROID) 88 MCG tablet Take 88 mcg by mouth daily before breakfast., Until Discontinued, Historical Med    metoprolol (LOPRESSOR) 50 MG tablet Take 50 mg by mouth 2 (two) times daily., Until Discontinued, Historical Med    pravastatin (PRAVACHOL) 20 MG tablet Take 20 mg by mouth daily., Until Discontinued, Historical Med    zolpidem (AMBIEN) 10 MG tablet Take 10 mg by mouth at bedtime as needed for sleep., Until Discontinued,  Historical Med       No Known Allergies Follow-up Information    [facility] Call in 2 weeks.   Specialty:  Gastroenterology   Why:  As needed   Contact information:   [address] [place] [address] [phone]       Follow up with Mary PontoHOLT,LYNLEY S, MD. Schedule an appointment as soon as possible for a visit in 1 week.   Specialty:  Family Medicine   Why:  with CBC, discuss about aspirin continuation    Contact information:   [address] ,[place] [id number] [phone]       The results of significant diagnostics from this hospitalization (including imaging, microbiology, ancillary and laboratory) are listed below for reference.    Significant Diagnostic Studies: Nm Gi Blood Loss  11/16/2015  CLINICAL DATA:  GI bleed this morning. EXAM: NUCLEAR MEDICINE GASTROINTESTINAL BLEEDING SCAN TECHNIQUE: Sequential abdominal images were obtained following intravenous administration of Tc-6531m labeled red blood cells. RADIOPHARMACEUTICALS:  26.0 mCi Tc-6731m in-vitro labeled red cells. COMPARISON:  None. FINDINGS: Imaging was carried out for 2 hours. No radiotracer accumulation to localize the GI bleed. IMPRESSION: No visible radiotracer accumulation to localize the GI bleed. Electronically  Signed   By: Charlett Nose M.D.   On: 11/16/2015 16:02    Microbiology: Recent Results (from the past 240 hour(s))  MRSA PCR Screening     Status: None   Collection Time: 11/16/15  8:00 AM  Result Value Ref Range Status   MRSA by PCR NEGATIVE NEGATIVE Final    Comment:        The GeneXpert MRSA Assay (FDA approved for NASAL specimens only), is one component of a comprehensive MRSA colonization surveillance program. It is not intended to diagnose MRSA infection nor to guide or monitor treatment for MRSA infections.      Labs: CBC:  Recent Labs Lab 11/16/15 1125 11/17/15 0302 11/18/15 0615  WBC 12.2* 8.3 9.0  HGB 12.7 10.5* 11.2*  HCT 39.4  33.1* 35.3*  MCV 94.7 94.8 94.9  PLT 417* 359 417*   Basic Metabolic Panel:  Recent Labs Lab 11/16/15 1125 11/18/15 0615  NA 142 139  K 4.0 3.2*  CL 107 103  CO2 24 30  GLUCOSE 155* 111*  BUN 19 8  CREATININE 0.92 0.82  CALCIUM 8.3* 8.4*  MG 1.9  --   PHOS 2.9  --    Liver Function Tests:  Recent Labs Lab 11/16/15 1125  AST 24  ALT 11*  ALKPHOS 49  BILITOT 0.5  PROT 5.6*  ALBUMIN 3.1*   Time spent: 30 minutes  Signed:  Antonela Freiman  [facility] 11/18/2015, 5:39 PM

## 2015-11-30 DIAGNOSIS — E782 Mixed hyperlipidemia: Secondary | ICD-10-CM | POA: Diagnosis not present

## 2015-11-30 DIAGNOSIS — Z8719 Personal history of other diseases of the digestive system: Secondary | ICD-10-CM | POA: Diagnosis not present

## 2015-11-30 DIAGNOSIS — E039 Hypothyroidism, unspecified: Secondary | ICD-10-CM | POA: Diagnosis not present

## 2016-01-09 DIAGNOSIS — H26493 Other secondary cataract, bilateral: Secondary | ICD-10-CM | POA: Diagnosis not present

## 2016-03-05 DIAGNOSIS — Z8719 Personal history of other diseases of the digestive system: Secondary | ICD-10-CM | POA: Diagnosis not present

## 2016-03-05 DIAGNOSIS — E782 Mixed hyperlipidemia: Secondary | ICD-10-CM | POA: Diagnosis not present

## 2016-03-05 DIAGNOSIS — I10 Essential (primary) hypertension: Secondary | ICD-10-CM | POA: Diagnosis not present

## 2016-03-05 DIAGNOSIS — E039 Hypothyroidism, unspecified: Secondary | ICD-10-CM | POA: Diagnosis not present

## 2016-03-05 DIAGNOSIS — Z79899 Other long term (current) drug therapy: Secondary | ICD-10-CM | POA: Diagnosis not present

## 2016-03-25 DIAGNOSIS — Z8719 Personal history of other diseases of the digestive system: Secondary | ICD-10-CM | POA: Diagnosis not present

## 2016-03-25 DIAGNOSIS — E7800 Pure hypercholesterolemia, unspecified: Secondary | ICD-10-CM | POA: Diagnosis not present

## 2016-03-25 DIAGNOSIS — I10 Essential (primary) hypertension: Secondary | ICD-10-CM | POA: Diagnosis not present

## 2016-03-25 DIAGNOSIS — I2699 Other pulmonary embolism without acute cor pulmonale: Secondary | ICD-10-CM | POA: Diagnosis not present

## 2016-03-25 DIAGNOSIS — I82402 Acute embolism and thrombosis of unspecified deep veins of left lower extremity: Secondary | ICD-10-CM | POA: Diagnosis not present

## 2016-03-25 DIAGNOSIS — I82442 Acute embolism and thrombosis of left tibial vein: Secondary | ICD-10-CM | POA: Diagnosis not present

## 2016-03-25 DIAGNOSIS — T502X5A Adverse effect of carbonic-anhydrase inhibitors, benzothiadiazides and other diuretics, initial encounter: Secondary | ICD-10-CM | POA: Diagnosis present

## 2016-03-25 DIAGNOSIS — Z79899 Other long term (current) drug therapy: Secondary | ICD-10-CM | POA: Diagnosis not present

## 2016-03-25 DIAGNOSIS — Z66 Do not resuscitate: Secondary | ICD-10-CM | POA: Diagnosis present

## 2016-03-25 DIAGNOSIS — I824Z2 Acute embolism and thrombosis of unspecified deep veins of left distal lower extremity: Secondary | ICD-10-CM | POA: Diagnosis not present

## 2016-03-25 DIAGNOSIS — I82412 Acute embolism and thrombosis of left femoral vein: Secondary | ICD-10-CM | POA: Diagnosis not present

## 2016-03-25 DIAGNOSIS — I482 Chronic atrial fibrillation: Secondary | ICD-10-CM | POA: Diagnosis not present

## 2016-03-25 DIAGNOSIS — I80202 Phlebitis and thrombophlebitis of unspecified deep vessels of left lower extremity: Secondary | ICD-10-CM | POA: Diagnosis not present

## 2016-03-25 DIAGNOSIS — E876 Hypokalemia: Secondary | ICD-10-CM | POA: Diagnosis present

## 2016-03-28 DIAGNOSIS — I4891 Unspecified atrial fibrillation: Secondary | ICD-10-CM | POA: Diagnosis not present

## 2016-03-28 DIAGNOSIS — I482 Chronic atrial fibrillation: Secondary | ICD-10-CM | POA: Diagnosis not present

## 2016-03-28 DIAGNOSIS — I82409 Acute embolism and thrombosis of unspecified deep veins of unspecified lower extremity: Secondary | ICD-10-CM | POA: Diagnosis not present

## 2016-03-28 DIAGNOSIS — I80202 Phlebitis and thrombophlebitis of unspecified deep vessels of left lower extremity: Secondary | ICD-10-CM | POA: Diagnosis not present

## 2016-03-28 DIAGNOSIS — I2699 Other pulmonary embolism without acute cor pulmonale: Secondary | ICD-10-CM | POA: Diagnosis not present

## 2016-03-28 DIAGNOSIS — I82402 Acute embolism and thrombosis of unspecified deep veins of left lower extremity: Secondary | ICD-10-CM | POA: Diagnosis not present

## 2016-03-28 DIAGNOSIS — E876 Hypokalemia: Secondary | ICD-10-CM | POA: Diagnosis not present

## 2016-03-28 DIAGNOSIS — R262 Difficulty in walking, not elsewhere classified: Secondary | ICD-10-CM | POA: Diagnosis not present

## 2016-03-28 DIAGNOSIS — I10 Essential (primary) hypertension: Secondary | ICD-10-CM | POA: Diagnosis not present

## 2016-03-29 DIAGNOSIS — I2699 Other pulmonary embolism without acute cor pulmonale: Secondary | ICD-10-CM | POA: Diagnosis not present

## 2016-03-29 DIAGNOSIS — I4891 Unspecified atrial fibrillation: Secondary | ICD-10-CM | POA: Diagnosis not present

## 2016-03-29 DIAGNOSIS — R262 Difficulty in walking, not elsewhere classified: Secondary | ICD-10-CM | POA: Diagnosis not present

## 2016-03-29 DIAGNOSIS — I82409 Acute embolism and thrombosis of unspecified deep veins of unspecified lower extremity: Secondary | ICD-10-CM | POA: Diagnosis not present

## 2016-04-12 DIAGNOSIS — I2699 Other pulmonary embolism without acute cor pulmonale: Secondary | ICD-10-CM | POA: Diagnosis not present

## 2016-04-12 DIAGNOSIS — I82402 Acute embolism and thrombosis of unspecified deep veins of left lower extremity: Secondary | ICD-10-CM | POA: Diagnosis not present

## 2016-04-16 DIAGNOSIS — I2699 Other pulmonary embolism without acute cor pulmonale: Secondary | ICD-10-CM | POA: Diagnosis not present

## 2016-04-16 DIAGNOSIS — I82402 Acute embolism and thrombosis of unspecified deep veins of left lower extremity: Secondary | ICD-10-CM | POA: Diagnosis not present

## 2016-04-19 DIAGNOSIS — I82402 Acute embolism and thrombosis of unspecified deep veins of left lower extremity: Secondary | ICD-10-CM | POA: Diagnosis not present

## 2016-04-19 DIAGNOSIS — I2699 Other pulmonary embolism without acute cor pulmonale: Secondary | ICD-10-CM | POA: Diagnosis not present

## 2016-04-23 DIAGNOSIS — I82402 Acute embolism and thrombosis of unspecified deep veins of left lower extremity: Secondary | ICD-10-CM | POA: Diagnosis not present

## 2016-04-23 DIAGNOSIS — I2699 Other pulmonary embolism without acute cor pulmonale: Secondary | ICD-10-CM | POA: Diagnosis not present

## 2016-04-24 DIAGNOSIS — I2699 Other pulmonary embolism without acute cor pulmonale: Secondary | ICD-10-CM | POA: Diagnosis not present

## 2016-04-24 DIAGNOSIS — I82402 Acute embolism and thrombosis of unspecified deep veins of left lower extremity: Secondary | ICD-10-CM | POA: Diagnosis not present

## 2016-04-25 DIAGNOSIS — Z79899 Other long term (current) drug therapy: Secondary | ICD-10-CM | POA: Diagnosis not present

## 2016-04-25 DIAGNOSIS — Z5181 Encounter for therapeutic drug level monitoring: Secondary | ICD-10-CM | POA: Diagnosis not present

## 2016-04-25 DIAGNOSIS — E876 Hypokalemia: Secondary | ICD-10-CM | POA: Diagnosis not present

## 2016-04-25 DIAGNOSIS — I2699 Other pulmonary embolism without acute cor pulmonale: Secondary | ICD-10-CM | POA: Diagnosis not present

## 2016-04-25 DIAGNOSIS — I82502 Chronic embolism and thrombosis of unspecified deep veins of left lower extremity: Secondary | ICD-10-CM | POA: Diagnosis not present

## 2016-04-25 DIAGNOSIS — Z7901 Long term (current) use of anticoagulants: Secondary | ICD-10-CM | POA: Diagnosis not present

## 2016-04-26 DIAGNOSIS — I82402 Acute embolism and thrombosis of unspecified deep veins of left lower extremity: Secondary | ICD-10-CM | POA: Diagnosis not present

## 2016-04-26 DIAGNOSIS — I2699 Other pulmonary embolism without acute cor pulmonale: Secondary | ICD-10-CM | POA: Diagnosis not present

## 2016-04-30 DIAGNOSIS — I82402 Acute embolism and thrombosis of unspecified deep veins of left lower extremity: Secondary | ICD-10-CM | POA: Diagnosis not present

## 2016-04-30 DIAGNOSIS — I2699 Other pulmonary embolism without acute cor pulmonale: Secondary | ICD-10-CM | POA: Diagnosis not present

## 2016-05-02 DIAGNOSIS — I2699 Other pulmonary embolism without acute cor pulmonale: Secondary | ICD-10-CM | POA: Diagnosis not present

## 2016-05-02 DIAGNOSIS — I82402 Acute embolism and thrombosis of unspecified deep veins of left lower extremity: Secondary | ICD-10-CM | POA: Diagnosis not present

## 2016-05-09 DIAGNOSIS — Z5181 Encounter for therapeutic drug level monitoring: Secondary | ICD-10-CM | POA: Diagnosis not present

## 2016-05-09 DIAGNOSIS — Z7901 Long term (current) use of anticoagulants: Secondary | ICD-10-CM | POA: Diagnosis not present

## 2016-05-16 DIAGNOSIS — Z5181 Encounter for therapeutic drug level monitoring: Secondary | ICD-10-CM | POA: Diagnosis not present

## 2016-05-16 DIAGNOSIS — Z7901 Long term (current) use of anticoagulants: Secondary | ICD-10-CM | POA: Diagnosis not present

## 2016-06-15 DIAGNOSIS — I2699 Other pulmonary embolism without acute cor pulmonale: Secondary | ICD-10-CM | POA: Diagnosis not present

## 2016-06-15 DIAGNOSIS — Z9181 History of falling: Secondary | ICD-10-CM | POA: Diagnosis not present

## 2016-06-15 DIAGNOSIS — Z7901 Long term (current) use of anticoagulants: Secondary | ICD-10-CM | POA: Diagnosis not present

## 2016-06-15 DIAGNOSIS — Z1389 Encounter for screening for other disorder: Secondary | ICD-10-CM | POA: Diagnosis not present

## 2016-06-15 DIAGNOSIS — Z0000 Encounter for general adult medical examination without abnormal findings: Secondary | ICD-10-CM | POA: Diagnosis not present

## 2016-07-16 DIAGNOSIS — Z7901 Long term (current) use of anticoagulants: Secondary | ICD-10-CM | POA: Diagnosis not present

## 2016-07-16 DIAGNOSIS — Z0000 Encounter for general adult medical examination without abnormal findings: Secondary | ICD-10-CM | POA: Diagnosis not present

## 2016-07-16 DIAGNOSIS — Z79899 Other long term (current) drug therapy: Secondary | ICD-10-CM | POA: Diagnosis not present

## 2016-07-16 DIAGNOSIS — Z5181 Encounter for therapeutic drug level monitoring: Secondary | ICD-10-CM | POA: Diagnosis not present

## 2016-07-16 DIAGNOSIS — E039 Hypothyroidism, unspecified: Secondary | ICD-10-CM | POA: Diagnosis not present

## 2016-07-16 DIAGNOSIS — Z7902 Long term (current) use of antithrombotics/antiplatelets: Secondary | ICD-10-CM | POA: Diagnosis not present

## 2016-08-16 DIAGNOSIS — Z5181 Encounter for therapeutic drug level monitoring: Secondary | ICD-10-CM | POA: Diagnosis not present

## 2016-08-16 DIAGNOSIS — Z7901 Long term (current) use of anticoagulants: Secondary | ICD-10-CM | POA: Diagnosis not present

## 2016-09-14 DIAGNOSIS — Z7901 Long term (current) use of anticoagulants: Secondary | ICD-10-CM | POA: Diagnosis not present

## 2016-09-14 DIAGNOSIS — Z5181 Encounter for therapeutic drug level monitoring: Secondary | ICD-10-CM | POA: Diagnosis not present

## 2016-10-16 DIAGNOSIS — Z5181 Encounter for therapeutic drug level monitoring: Secondary | ICD-10-CM | POA: Diagnosis not present

## 2016-10-16 DIAGNOSIS — Z7901 Long term (current) use of anticoagulants: Secondary | ICD-10-CM | POA: Diagnosis not present

## 2016-11-14 DIAGNOSIS — Z5181 Encounter for therapeutic drug level monitoring: Secondary | ICD-10-CM | POA: Diagnosis not present

## 2016-11-14 DIAGNOSIS — Z7901 Long term (current) use of anticoagulants: Secondary | ICD-10-CM | POA: Diagnosis not present

## 2016-12-11 DIAGNOSIS — Z7901 Long term (current) use of anticoagulants: Secondary | ICD-10-CM | POA: Diagnosis not present

## 2016-12-11 DIAGNOSIS — Z5181 Encounter for therapeutic drug level monitoring: Secondary | ICD-10-CM | POA: Diagnosis not present

## 2016-12-20 DIAGNOSIS — R0902 Hypoxemia: Secondary | ICD-10-CM | POA: Diagnosis not present

## 2016-12-20 DIAGNOSIS — R05 Cough: Secondary | ICD-10-CM | POA: Diagnosis not present

## 2016-12-20 DIAGNOSIS — Z8719 Personal history of other diseases of the digestive system: Secondary | ICD-10-CM

## 2016-12-20 DIAGNOSIS — Z8711 Personal history of peptic ulcer disease: Secondary | ICD-10-CM | POA: Diagnosis not present

## 2016-12-20 DIAGNOSIS — Z7901 Long term (current) use of anticoagulants: Secondary | ICD-10-CM | POA: Diagnosis not present

## 2016-12-20 DIAGNOSIS — R42 Dizziness and giddiness: Secondary | ICD-10-CM | POA: Diagnosis not present

## 2016-12-20 DIAGNOSIS — R531 Weakness: Secondary | ICD-10-CM | POA: Diagnosis not present

## 2016-12-20 DIAGNOSIS — I10 Essential (primary) hypertension: Secondary | ICD-10-CM

## 2016-12-20 DIAGNOSIS — B954 Other streptococcus as the cause of diseases classified elsewhere: Secondary | ICD-10-CM | POA: Diagnosis present

## 2016-12-20 DIAGNOSIS — K830 Cholangitis: Secondary | ICD-10-CM | POA: Diagnosis present

## 2016-12-20 DIAGNOSIS — I82409 Acute embolism and thrombosis of unspecified deep veins of unspecified lower extremity: Secondary | ICD-10-CM

## 2016-12-20 DIAGNOSIS — R1084 Generalized abdominal pain: Secondary | ICD-10-CM | POA: Diagnosis not present

## 2016-12-20 DIAGNOSIS — K8000 Calculus of gallbladder with acute cholecystitis without obstruction: Secondary | ICD-10-CM | POA: Diagnosis not present

## 2016-12-20 DIAGNOSIS — R5381 Other malaise: Secondary | ICD-10-CM | POA: Diagnosis present

## 2016-12-20 DIAGNOSIS — I82503 Chronic embolism and thrombosis of unspecified deep veins of lower extremity, bilateral: Secondary | ICD-10-CM | POA: Diagnosis present

## 2016-12-20 DIAGNOSIS — Z79899 Other long term (current) drug therapy: Secondary | ICD-10-CM | POA: Diagnosis not present

## 2016-12-20 DIAGNOSIS — K819 Cholecystitis, unspecified: Secondary | ICD-10-CM | POA: Diagnosis not present

## 2016-12-20 DIAGNOSIS — K810 Acute cholecystitis: Secondary | ICD-10-CM

## 2016-12-20 DIAGNOSIS — R1011 Right upper quadrant pain: Secondary | ICD-10-CM | POA: Diagnosis not present

## 2016-12-20 DIAGNOSIS — G9340 Encephalopathy, unspecified: Secondary | ICD-10-CM | POA: Diagnosis not present

## 2016-12-20 DIAGNOSIS — Z8679 Personal history of other diseases of the circulatory system: Secondary | ICD-10-CM | POA: Diagnosis not present

## 2016-12-20 DIAGNOSIS — M7989 Other specified soft tissue disorders: Secondary | ICD-10-CM | POA: Diagnosis not present

## 2016-12-20 DIAGNOSIS — K8012 Calculus of gallbladder with acute and chronic cholecystitis without obstruction: Secondary | ICD-10-CM | POA: Diagnosis not present

## 2016-12-20 DIAGNOSIS — K922 Gastrointestinal hemorrhage, unspecified: Secondary | ICD-10-CM | POA: Diagnosis not present

## 2016-12-20 DIAGNOSIS — R1110 Vomiting, unspecified: Secondary | ICD-10-CM | POA: Diagnosis not present

## 2016-12-26 DIAGNOSIS — R262 Difficulty in walking, not elsewhere classified: Secondary | ICD-10-CM | POA: Diagnosis not present

## 2016-12-26 DIAGNOSIS — G9340 Encephalopathy, unspecified: Secondary | ICD-10-CM

## 2016-12-26 DIAGNOSIS — Z7901 Long term (current) use of anticoagulants: Secondary | ICD-10-CM

## 2016-12-26 DIAGNOSIS — K219 Gastro-esophageal reflux disease without esophagitis: Secondary | ICD-10-CM | POA: Diagnosis not present

## 2016-12-26 DIAGNOSIS — K819 Cholecystitis, unspecified: Secondary | ICD-10-CM | POA: Diagnosis not present

## 2016-12-26 DIAGNOSIS — Z8719 Personal history of other diseases of the digestive system: Secondary | ICD-10-CM

## 2016-12-26 DIAGNOSIS — I119 Hypertensive heart disease without heart failure: Secondary | ICD-10-CM | POA: Diagnosis not present

## 2016-12-26 DIAGNOSIS — R05 Cough: Secondary | ICD-10-CM | POA: Diagnosis not present

## 2016-12-26 DIAGNOSIS — R531 Weakness: Secondary | ICD-10-CM | POA: Diagnosis not present

## 2016-12-26 DIAGNOSIS — R0902 Hypoxemia: Secondary | ICD-10-CM

## 2016-12-26 DIAGNOSIS — Z79899 Other long term (current) drug therapy: Secondary | ICD-10-CM | POA: Diagnosis not present

## 2016-12-26 DIAGNOSIS — K810 Acute cholecystitis: Secondary | ICD-10-CM

## 2016-12-26 DIAGNOSIS — R1084 Generalized abdominal pain: Secondary | ICD-10-CM | POA: Diagnosis not present

## 2016-12-26 DIAGNOSIS — R42 Dizziness and giddiness: Secondary | ICD-10-CM | POA: Diagnosis not present

## 2016-12-31 DIAGNOSIS — I119 Hypertensive heart disease without heart failure: Secondary | ICD-10-CM | POA: Diagnosis not present

## 2016-12-31 DIAGNOSIS — K810 Acute cholecystitis: Secondary | ICD-10-CM | POA: Diagnosis not present

## 2016-12-31 DIAGNOSIS — K219 Gastro-esophageal reflux disease without esophagitis: Secondary | ICD-10-CM | POA: Diagnosis not present

## 2016-12-31 DIAGNOSIS — R262 Difficulty in walking, not elsewhere classified: Secondary | ICD-10-CM | POA: Diagnosis not present

## 2017-01-10 DIAGNOSIS — R109 Unspecified abdominal pain: Secondary | ICD-10-CM | POA: Diagnosis not present

## 2017-01-30 DIAGNOSIS — T85518A Breakdown (mechanical) of other gastrointestinal prosthetic devices, implants and grafts, initial encounter: Secondary | ICD-10-CM | POA: Diagnosis not present

## 2017-02-01 DIAGNOSIS — R109 Unspecified abdominal pain: Secondary | ICD-10-CM | POA: Diagnosis not present

## 2017-02-01 DIAGNOSIS — T85518A Breakdown (mechanical) of other gastrointestinal prosthetic devices, implants and grafts, initial encounter: Secondary | ICD-10-CM | POA: Diagnosis not present

## 2017-02-21 DIAGNOSIS — M439 Deforming dorsopathy, unspecified: Secondary | ICD-10-CM | POA: Diagnosis not present

## 2017-02-21 DIAGNOSIS — M2550 Pain in unspecified joint: Secondary | ICD-10-CM | POA: Diagnosis not present

## 2017-02-21 DIAGNOSIS — E785 Hyperlipidemia, unspecified: Secondary | ICD-10-CM | POA: Diagnosis not present

## 2017-02-21 DIAGNOSIS — K219 Gastro-esophageal reflux disease without esophagitis: Secondary | ICD-10-CM | POA: Diagnosis not present

## 2017-02-21 DIAGNOSIS — I4891 Unspecified atrial fibrillation: Secondary | ICD-10-CM | POA: Diagnosis not present

## 2017-02-21 DIAGNOSIS — R29898 Other symptoms and signs involving the musculoskeletal system: Secondary | ICD-10-CM | POA: Diagnosis not present

## 2017-02-21 DIAGNOSIS — I1310 Hypertensive heart and chronic kidney disease without heart failure, with stage 1 through stage 4 chronic kidney disease, or unspecified chronic kidney disease: Secondary | ICD-10-CM | POA: Diagnosis not present

## 2017-02-26 DIAGNOSIS — I82531 Chronic embolism and thrombosis of right popliteal vein: Secondary | ICD-10-CM | POA: Diagnosis not present

## 2017-02-26 DIAGNOSIS — K811 Chronic cholecystitis: Secondary | ICD-10-CM | POA: Diagnosis not present

## 2017-02-26 DIAGNOSIS — I482 Chronic atrial fibrillation: Secondary | ICD-10-CM | POA: Diagnosis not present

## 2017-03-03 IMAGING — NM NM GI BLOOD LOSS
2 series · 12 of 12 positions shown · non-contrast
Comparison: None.

CLINICAL DATA: GI bleed this morning.

EXAM:
NUCLEAR MEDICINE GASTROINTESTINAL BLEEDING SCAN
TECHNIQUE: Sequential abdominal images were obtained following intravenous
administration of Zc-GGm labeled red blood cells.
RADIOPHARMACEUTICALS:  26.0 mCi Zc-GGm in-vitro labeled red cells.

[gi gi bleed · 5.01mm/px · 6 of 60 frames shown (1 of 2)]
[frame 6/60]
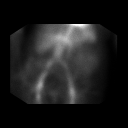
[frame 16/60]
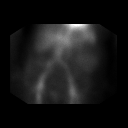
[frame 26/60]
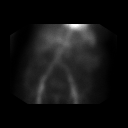
[frame 36/60]
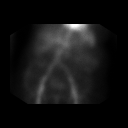
[frame 46/60]
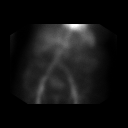
[frame 56/60]
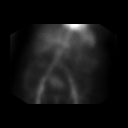

[gi gi bleed · 5.01mm/px · 6 of 60 frames shown (2 of 2)]
[frame 6/60]
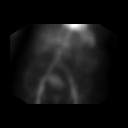
[frame 16/60]
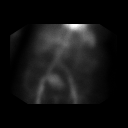
[frame 26/60]
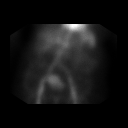
[frame 36/60]
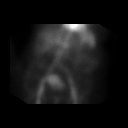
[frame 46/60]
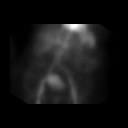
[frame 56/60]
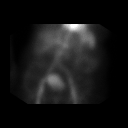

[12 of 12 positions shown; findings below may reference images not displayed]

FINDINGS: Imaging was carried [DATE] hours. No radiotracer accumulation to
localize the GI bleed.
IMPRESSION: No visible radiotracer accumulation to localize the GI bleed.

## 2017-03-18 DIAGNOSIS — K219 Gastro-esophageal reflux disease without esophagitis: Secondary | ICD-10-CM | POA: Diagnosis not present

## 2017-03-18 DIAGNOSIS — R748 Abnormal levels of other serum enzymes: Secondary | ICD-10-CM | POA: Diagnosis present

## 2017-03-18 DIAGNOSIS — K8051 Calculus of bile duct without cholangitis or cholecystitis with obstruction: Secondary | ICD-10-CM | POA: Diagnosis not present

## 2017-03-18 DIAGNOSIS — Z8669 Personal history of other diseases of the nervous system and sense organs: Secondary | ICD-10-CM | POA: Diagnosis not present

## 2017-03-18 DIAGNOSIS — Z8711 Personal history of peptic ulcer disease: Secondary | ICD-10-CM | POA: Diagnosis not present

## 2017-03-18 DIAGNOSIS — I10 Essential (primary) hypertension: Secondary | ICD-10-CM | POA: Diagnosis not present

## 2017-03-18 DIAGNOSIS — Z79899 Other long term (current) drug therapy: Secondary | ICD-10-CM | POA: Diagnosis not present

## 2017-03-18 DIAGNOSIS — K8067 Calculus of gallbladder and bile duct with acute and chronic cholecystitis with obstruction: Secondary | ICD-10-CM | POA: Diagnosis present

## 2017-03-18 DIAGNOSIS — M1990 Unspecified osteoarthritis, unspecified site: Secondary | ICD-10-CM | POA: Diagnosis present

## 2017-03-18 DIAGNOSIS — Z9981 Dependence on supplemental oxygen: Secondary | ICD-10-CM | POA: Diagnosis not present

## 2017-03-18 DIAGNOSIS — E876 Hypokalemia: Secondary | ICD-10-CM | POA: Diagnosis not present

## 2017-03-18 DIAGNOSIS — R945 Abnormal results of liver function studies: Secondary | ICD-10-CM | POA: Diagnosis not present

## 2017-03-18 DIAGNOSIS — J90 Pleural effusion, not elsewhere classified: Secondary | ICD-10-CM | POA: Diagnosis not present

## 2017-03-18 DIAGNOSIS — K8064 Calculus of gallbladder and bile duct with chronic cholecystitis without obstruction: Secondary | ICD-10-CM | POA: Diagnosis present

## 2017-03-18 DIAGNOSIS — Z48815 Encounter for surgical aftercare following surgery on the digestive system: Secondary | ICD-10-CM | POA: Diagnosis not present

## 2017-03-18 DIAGNOSIS — I82531 Chronic embolism and thrombosis of right popliteal vein: Secondary | ICD-10-CM | POA: Diagnosis not present

## 2017-03-18 DIAGNOSIS — G9340 Encephalopathy, unspecified: Secondary | ICD-10-CM | POA: Diagnosis not present

## 2017-03-18 DIAGNOSIS — E7800 Pure hypercholesterolemia, unspecified: Secondary | ICD-10-CM | POA: Diagnosis not present

## 2017-03-18 DIAGNOSIS — K8019 Calculus of gallbladder with other cholecystitis with obstruction: Secondary | ICD-10-CM | POA: Diagnosis not present

## 2017-03-18 DIAGNOSIS — K8050 Calculus of bile duct without cholangitis or cholecystitis without obstruction: Secondary | ICD-10-CM | POA: Diagnosis not present

## 2017-03-18 DIAGNOSIS — K8012 Calculus of gallbladder with acute and chronic cholecystitis without obstruction: Secondary | ICD-10-CM | POA: Diagnosis not present

## 2017-03-18 DIAGNOSIS — K810 Acute cholecystitis: Secondary | ICD-10-CM | POA: Diagnosis not present

## 2017-03-18 DIAGNOSIS — R42 Dizziness and giddiness: Secondary | ICD-10-CM | POA: Diagnosis not present

## 2017-03-18 DIAGNOSIS — R0902 Hypoxemia: Secondary | ICD-10-CM | POA: Diagnosis not present

## 2017-03-18 DIAGNOSIS — H9190 Unspecified hearing loss, unspecified ear: Secondary | ICD-10-CM | POA: Diagnosis present

## 2017-03-18 DIAGNOSIS — I482 Chronic atrial fibrillation: Secondary | ICD-10-CM | POA: Diagnosis not present

## 2017-03-18 DIAGNOSIS — Z7901 Long term (current) use of anticoagulants: Secondary | ICD-10-CM | POA: Diagnosis not present

## 2017-03-18 DIAGNOSIS — K811 Chronic cholecystitis: Secondary | ICD-10-CM | POA: Diagnosis not present

## 2017-03-18 DIAGNOSIS — I444 Left anterior fascicular block: Secondary | ICD-10-CM | POA: Diagnosis not present

## 2017-03-18 DIAGNOSIS — R9431 Abnormal electrocardiogram [ECG] [EKG]: Secondary | ICD-10-CM | POA: Diagnosis not present

## 2017-03-18 DIAGNOSIS — E039 Hypothyroidism, unspecified: Secondary | ICD-10-CM | POA: Diagnosis not present

## 2017-03-18 DIAGNOSIS — Z86718 Personal history of other venous thrombosis and embolism: Secondary | ICD-10-CM | POA: Diagnosis not present

## 2017-03-18 DIAGNOSIS — E785 Hyperlipidemia, unspecified: Secondary | ICD-10-CM | POA: Diagnosis present

## 2017-03-18 DIAGNOSIS — Z86711 Personal history of pulmonary embolism: Secondary | ICD-10-CM | POA: Diagnosis not present

## 2017-03-18 DIAGNOSIS — Z7982 Long term (current) use of aspirin: Secondary | ICD-10-CM | POA: Diagnosis not present

## 2017-03-18 DIAGNOSIS — Z8719 Personal history of other diseases of the digestive system: Secondary | ICD-10-CM | POA: Diagnosis not present

## 2017-03-18 DIAGNOSIS — N179 Acute kidney failure, unspecified: Secondary | ICD-10-CM | POA: Diagnosis not present

## 2017-03-22 DIAGNOSIS — I482 Chronic atrial fibrillation: Secondary | ICD-10-CM | POA: Diagnosis not present

## 2017-03-22 DIAGNOSIS — K8050 Calculus of bile duct without cholangitis or cholecystitis without obstruction: Secondary | ICD-10-CM | POA: Diagnosis not present

## 2017-03-22 DIAGNOSIS — E039 Hypothyroidism, unspecified: Secondary | ICD-10-CM | POA: Diagnosis not present

## 2017-03-22 DIAGNOSIS — Z48815 Encounter for surgical aftercare following surgery on the digestive system: Secondary | ICD-10-CM | POA: Diagnosis not present

## 2017-03-22 DIAGNOSIS — R42 Dizziness and giddiness: Secondary | ICD-10-CM | POA: Diagnosis not present

## 2017-03-22 DIAGNOSIS — G9340 Encephalopathy, unspecified: Secondary | ICD-10-CM | POA: Diagnosis not present

## 2017-03-22 DIAGNOSIS — K810 Acute cholecystitis: Secondary | ICD-10-CM | POA: Diagnosis not present

## 2017-03-22 DIAGNOSIS — I10 Essential (primary) hypertension: Secondary | ICD-10-CM | POA: Diagnosis not present

## 2017-03-22 DIAGNOSIS — Z8719 Personal history of other diseases of the digestive system: Secondary | ICD-10-CM | POA: Diagnosis not present

## 2017-03-22 DIAGNOSIS — Z7901 Long term (current) use of anticoagulants: Secondary | ICD-10-CM | POA: Diagnosis not present

## 2017-03-22 DIAGNOSIS — K219 Gastro-esophageal reflux disease without esophagitis: Secondary | ICD-10-CM | POA: Diagnosis not present

## 2017-03-22 DIAGNOSIS — I2699 Other pulmonary embolism without acute cor pulmonale: Secondary | ICD-10-CM | POA: Diagnosis not present

## 2017-03-22 DIAGNOSIS — R0902 Hypoxemia: Secondary | ICD-10-CM | POA: Diagnosis not present

## 2017-03-22 DIAGNOSIS — K811 Chronic cholecystitis: Secondary | ICD-10-CM | POA: Diagnosis not present

## 2017-03-22 DIAGNOSIS — I82409 Acute embolism and thrombosis of unspecified deep veins of unspecified lower extremity: Secondary | ICD-10-CM | POA: Diagnosis not present

## 2017-03-22 DIAGNOSIS — R262 Difficulty in walking, not elsewhere classified: Secondary | ICD-10-CM | POA: Diagnosis not present

## 2017-03-22 DIAGNOSIS — E7800 Pure hypercholesterolemia, unspecified: Secondary | ICD-10-CM | POA: Diagnosis not present

## 2017-03-22 DIAGNOSIS — N179 Acute kidney failure, unspecified: Secondary | ICD-10-CM | POA: Diagnosis not present

## 2017-03-22 DIAGNOSIS — L089 Local infection of the skin and subcutaneous tissue, unspecified: Secondary | ICD-10-CM | POA: Diagnosis not present

## 2017-03-22 DIAGNOSIS — I82531 Chronic embolism and thrombosis of right popliteal vein: Secondary | ICD-10-CM | POA: Diagnosis not present

## 2017-03-22 DIAGNOSIS — G8918 Other acute postprocedural pain: Secondary | ICD-10-CM | POA: Diagnosis not present

## 2017-03-22 DIAGNOSIS — E785 Hyperlipidemia, unspecified: Secondary | ICD-10-CM | POA: Diagnosis not present

## 2017-03-22 DIAGNOSIS — I4891 Unspecified atrial fibrillation: Secondary | ICD-10-CM | POA: Diagnosis not present

## 2017-03-22 DIAGNOSIS — E876 Hypokalemia: Secondary | ICD-10-CM | POA: Diagnosis not present

## 2017-03-24 DIAGNOSIS — E785 Hyperlipidemia, unspecified: Secondary | ICD-10-CM | POA: Diagnosis not present

## 2017-03-24 DIAGNOSIS — G8918 Other acute postprocedural pain: Secondary | ICD-10-CM | POA: Diagnosis not present

## 2017-03-24 DIAGNOSIS — K219 Gastro-esophageal reflux disease without esophagitis: Secondary | ICD-10-CM | POA: Diagnosis not present

## 2017-03-24 DIAGNOSIS — E039 Hypothyroidism, unspecified: Secondary | ICD-10-CM | POA: Diagnosis not present

## 2017-03-29 DIAGNOSIS — R262 Difficulty in walking, not elsewhere classified: Secondary | ICD-10-CM | POA: Diagnosis not present

## 2017-03-29 DIAGNOSIS — I2699 Other pulmonary embolism without acute cor pulmonale: Secondary | ICD-10-CM | POA: Diagnosis not present

## 2017-03-29 DIAGNOSIS — I4891 Unspecified atrial fibrillation: Secondary | ICD-10-CM | POA: Diagnosis not present

## 2017-03-29 DIAGNOSIS — I82409 Acute embolism and thrombosis of unspecified deep veins of unspecified lower extremity: Secondary | ICD-10-CM | POA: Diagnosis not present

## 2017-04-04 DIAGNOSIS — E119 Type 2 diabetes mellitus without complications: Secondary | ICD-10-CM | POA: Diagnosis not present

## 2017-04-23 DIAGNOSIS — K219 Gastro-esophageal reflux disease without esophagitis: Secondary | ICD-10-CM | POA: Diagnosis not present

## 2017-04-23 DIAGNOSIS — I4891 Unspecified atrial fibrillation: Secondary | ICD-10-CM | POA: Diagnosis not present

## 2017-04-23 DIAGNOSIS — I1310 Hypertensive heart and chronic kidney disease without heart failure, with stage 1 through stage 4 chronic kidney disease, or unspecified chronic kidney disease: Secondary | ICD-10-CM | POA: Diagnosis not present

## 2017-04-23 DIAGNOSIS — M2550 Pain in unspecified joint: Secondary | ICD-10-CM | POA: Diagnosis not present

## 2017-04-23 DIAGNOSIS — E785 Hyperlipidemia, unspecified: Secondary | ICD-10-CM | POA: Diagnosis not present

## 2017-04-23 DIAGNOSIS — M439 Deforming dorsopathy, unspecified: Secondary | ICD-10-CM | POA: Diagnosis not present

## 2017-04-23 DIAGNOSIS — R29898 Other symptoms and signs involving the musculoskeletal system: Secondary | ICD-10-CM | POA: Diagnosis not present

## 2017-05-20 DIAGNOSIS — E039 Hypothyroidism, unspecified: Secondary | ICD-10-CM | POA: Diagnosis not present

## 2017-05-20 DIAGNOSIS — E119 Type 2 diabetes mellitus without complications: Secondary | ICD-10-CM | POA: Diagnosis not present

## 2017-05-25 DIAGNOSIS — K5900 Constipation, unspecified: Secondary | ICD-10-CM | POA: Diagnosis not present

## 2017-05-25 DIAGNOSIS — K219 Gastro-esophageal reflux disease without esophagitis: Secondary | ICD-10-CM | POA: Diagnosis not present

## 2017-05-25 DIAGNOSIS — D649 Anemia, unspecified: Secondary | ICD-10-CM | POA: Diagnosis not present

## 2017-05-25 DIAGNOSIS — E785 Hyperlipidemia, unspecified: Secondary | ICD-10-CM | POA: Diagnosis not present

## 2017-05-25 DIAGNOSIS — I739 Peripheral vascular disease, unspecified: Secondary | ICD-10-CM | POA: Diagnosis not present

## 2017-05-25 DIAGNOSIS — M2550 Pain in unspecified joint: Secondary | ICD-10-CM | POA: Diagnosis not present

## 2017-05-25 DIAGNOSIS — E039 Hypothyroidism, unspecified: Secondary | ICD-10-CM | POA: Diagnosis not present

## 2017-05-25 DIAGNOSIS — R29898 Other symptoms and signs involving the musculoskeletal system: Secondary | ICD-10-CM | POA: Diagnosis not present

## 2017-06-11 DIAGNOSIS — R05 Cough: Secondary | ICD-10-CM | POA: Diagnosis not present

## 2017-06-24 DIAGNOSIS — K5900 Constipation, unspecified: Secondary | ICD-10-CM | POA: Diagnosis not present

## 2017-06-24 DIAGNOSIS — D649 Anemia, unspecified: Secondary | ICD-10-CM | POA: Diagnosis not present

## 2017-06-24 DIAGNOSIS — R29898 Other symptoms and signs involving the musculoskeletal system: Secondary | ICD-10-CM | POA: Diagnosis not present

## 2017-06-24 DIAGNOSIS — M2550 Pain in unspecified joint: Secondary | ICD-10-CM | POA: Diagnosis not present

## 2017-06-24 DIAGNOSIS — E785 Hyperlipidemia, unspecified: Secondary | ICD-10-CM | POA: Diagnosis not present

## 2017-06-24 DIAGNOSIS — I739 Peripheral vascular disease, unspecified: Secondary | ICD-10-CM | POA: Diagnosis not present

## 2017-06-24 DIAGNOSIS — E039 Hypothyroidism, unspecified: Secondary | ICD-10-CM | POA: Diagnosis not present

## 2017-06-24 DIAGNOSIS — K219 Gastro-esophageal reflux disease without esophagitis: Secondary | ICD-10-CM | POA: Diagnosis not present

## 2017-07-24 DIAGNOSIS — D649 Anemia, unspecified: Secondary | ICD-10-CM | POA: Diagnosis not present

## 2017-07-24 DIAGNOSIS — E785 Hyperlipidemia, unspecified: Secondary | ICD-10-CM | POA: Diagnosis not present

## 2017-07-24 DIAGNOSIS — I739 Peripheral vascular disease, unspecified: Secondary | ICD-10-CM | POA: Diagnosis not present

## 2017-07-24 DIAGNOSIS — M810 Age-related osteoporosis without current pathological fracture: Secondary | ICD-10-CM | POA: Diagnosis not present

## 2017-07-24 DIAGNOSIS — E039 Hypothyroidism, unspecified: Secondary | ICD-10-CM | POA: Diagnosis not present

## 2017-07-24 DIAGNOSIS — K219 Gastro-esophageal reflux disease without esophagitis: Secondary | ICD-10-CM | POA: Diagnosis not present

## 2017-07-24 DIAGNOSIS — I119 Hypertensive heart disease without heart failure: Secondary | ICD-10-CM | POA: Diagnosis not present

## 2017-07-24 DIAGNOSIS — I4891 Unspecified atrial fibrillation: Secondary | ICD-10-CM | POA: Diagnosis not present

## 2017-08-23 DIAGNOSIS — E785 Hyperlipidemia, unspecified: Secondary | ICD-10-CM | POA: Diagnosis not present

## 2017-08-23 DIAGNOSIS — I4891 Unspecified atrial fibrillation: Secondary | ICD-10-CM | POA: Diagnosis not present

## 2017-08-23 DIAGNOSIS — I119 Hypertensive heart disease without heart failure: Secondary | ICD-10-CM | POA: Diagnosis not present

## 2017-08-23 DIAGNOSIS — K219 Gastro-esophageal reflux disease without esophagitis: Secondary | ICD-10-CM | POA: Diagnosis not present

## 2017-08-23 DIAGNOSIS — I739 Peripheral vascular disease, unspecified: Secondary | ICD-10-CM | POA: Diagnosis not present

## 2017-08-23 DIAGNOSIS — M810 Age-related osteoporosis without current pathological fracture: Secondary | ICD-10-CM | POA: Diagnosis not present

## 2017-08-23 DIAGNOSIS — D649 Anemia, unspecified: Secondary | ICD-10-CM | POA: Diagnosis not present

## 2017-08-23 DIAGNOSIS — E039 Hypothyroidism, unspecified: Secondary | ICD-10-CM | POA: Diagnosis not present

## 2017-09-22 DIAGNOSIS — N183 Chronic kidney disease, stage 3 (moderate): Secondary | ICD-10-CM | POA: Diagnosis not present

## 2017-09-22 DIAGNOSIS — M2550 Pain in unspecified joint: Secondary | ICD-10-CM | POA: Diagnosis not present

## 2017-09-22 DIAGNOSIS — I4891 Unspecified atrial fibrillation: Secondary | ICD-10-CM | POA: Diagnosis not present

## 2017-09-22 DIAGNOSIS — Z4889 Encounter for other specified surgical aftercare: Secondary | ICD-10-CM | POA: Diagnosis not present

## 2017-09-22 DIAGNOSIS — I509 Heart failure, unspecified: Secondary | ICD-10-CM | POA: Diagnosis not present

## 2017-09-22 DIAGNOSIS — R29898 Other symptoms and signs involving the musculoskeletal system: Secondary | ICD-10-CM | POA: Diagnosis not present

## 2017-09-22 DIAGNOSIS — E785 Hyperlipidemia, unspecified: Secondary | ICD-10-CM | POA: Diagnosis not present

## 2017-09-22 DIAGNOSIS — K219 Gastro-esophageal reflux disease without esophagitis: Secondary | ICD-10-CM | POA: Diagnosis not present

## 2017-09-25 DIAGNOSIS — I482 Chronic atrial fibrillation: Secondary | ICD-10-CM | POA: Diagnosis not present

## 2017-09-25 DIAGNOSIS — K432 Incisional hernia without obstruction or gangrene: Secondary | ICD-10-CM | POA: Diagnosis not present

## 2017-10-07 DIAGNOSIS — I491 Atrial premature depolarization: Secondary | ICD-10-CM | POA: Diagnosis not present

## 2017-10-07 DIAGNOSIS — K469 Unspecified abdominal hernia without obstruction or gangrene: Secondary | ICD-10-CM | POA: Diagnosis not present

## 2017-10-07 DIAGNOSIS — Z4682 Encounter for fitting and adjustment of non-vascular catheter: Secondary | ICD-10-CM | POA: Diagnosis not present

## 2017-10-07 DIAGNOSIS — K432 Incisional hernia without obstruction or gangrene: Secondary | ICD-10-CM | POA: Diagnosis not present

## 2017-10-07 DIAGNOSIS — J479 Bronchiectasis, uncomplicated: Secondary | ICD-10-CM | POA: Diagnosis not present

## 2017-10-07 DIAGNOSIS — I97121 Postprocedural cardiac arrest following other surgery: Secondary | ICD-10-CM | POA: Diagnosis not present

## 2017-10-07 DIAGNOSIS — I444 Left anterior fascicular block: Secondary | ICD-10-CM | POA: Diagnosis not present

## 2017-10-08 DIAGNOSIS — I462 Cardiac arrest due to underlying cardiac condition: Secondary | ICD-10-CM | POA: Diagnosis present

## 2017-10-08 DIAGNOSIS — I739 Peripheral vascular disease, unspecified: Secondary | ICD-10-CM | POA: Diagnosis not present

## 2017-10-08 DIAGNOSIS — N179 Acute kidney failure, unspecified: Secondary | ICD-10-CM | POA: Diagnosis present

## 2017-10-08 DIAGNOSIS — K5649 Other impaction of intestine: Secondary | ICD-10-CM | POA: Diagnosis not present

## 2017-10-08 DIAGNOSIS — R571 Hypovolemic shock: Secondary | ICD-10-CM | POA: Diagnosis present

## 2017-10-08 DIAGNOSIS — R0902 Hypoxemia: Secondary | ICD-10-CM | POA: Diagnosis not present

## 2017-10-08 DIAGNOSIS — Z7982 Long term (current) use of aspirin: Secondary | ICD-10-CM | POA: Diagnosis not present

## 2017-10-08 DIAGNOSIS — E039 Hypothyroidism, unspecified: Secondary | ICD-10-CM | POA: Diagnosis not present

## 2017-10-08 DIAGNOSIS — I472 Ventricular tachycardia: Secondary | ICD-10-CM | POA: Diagnosis present

## 2017-10-08 DIAGNOSIS — I5021 Acute systolic (congestive) heart failure: Secondary | ICD-10-CM | POA: Diagnosis not present

## 2017-10-08 DIAGNOSIS — E872 Acidosis: Secondary | ICD-10-CM | POA: Diagnosis present

## 2017-10-08 DIAGNOSIS — Z7901 Long term (current) use of anticoagulants: Secondary | ICD-10-CM | POA: Diagnosis not present

## 2017-10-08 DIAGNOSIS — I469 Cardiac arrest, cause unspecified: Secondary | ICD-10-CM | POA: Diagnosis not present

## 2017-10-08 DIAGNOSIS — R918 Other nonspecific abnormal finding of lung field: Secondary | ICD-10-CM | POA: Diagnosis not present

## 2017-10-08 DIAGNOSIS — I130 Hypertensive heart and chronic kidney disease with heart failure and stage 1 through stage 4 chronic kidney disease, or unspecified chronic kidney disease: Secondary | ICD-10-CM | POA: Diagnosis not present

## 2017-10-08 DIAGNOSIS — N189 Chronic kidney disease, unspecified: Secondary | ICD-10-CM | POA: Diagnosis not present

## 2017-10-08 DIAGNOSIS — R42 Dizziness and giddiness: Secondary | ICD-10-CM | POA: Diagnosis not present

## 2017-10-08 DIAGNOSIS — I480 Paroxysmal atrial fibrillation: Secondary | ICD-10-CM | POA: Diagnosis not present

## 2017-10-08 DIAGNOSIS — I129 Hypertensive chronic kidney disease with stage 1 through stage 4 chronic kidney disease, or unspecified chronic kidney disease: Secondary | ICD-10-CM | POA: Diagnosis not present

## 2017-10-08 DIAGNOSIS — T45515A Adverse effect of anticoagulants, initial encounter: Secondary | ICD-10-CM | POA: Diagnosis not present

## 2017-10-08 DIAGNOSIS — K432 Incisional hernia without obstruction or gangrene: Secondary | ICD-10-CM | POA: Diagnosis present

## 2017-10-08 DIAGNOSIS — I4891 Unspecified atrial fibrillation: Secondary | ICD-10-CM | POA: Diagnosis not present

## 2017-10-08 DIAGNOSIS — K430 Incisional hernia with obstruction, without gangrene: Secondary | ICD-10-CM | POA: Diagnosis not present

## 2017-10-08 DIAGNOSIS — D72829 Elevated white blood cell count, unspecified: Secondary | ICD-10-CM | POA: Diagnosis not present

## 2017-10-08 DIAGNOSIS — I471 Supraventricular tachycardia: Secondary | ICD-10-CM | POA: Diagnosis present

## 2017-10-08 DIAGNOSIS — J9601 Acute respiratory failure with hypoxia: Secondary | ICD-10-CM | POA: Diagnosis present

## 2017-10-08 DIAGNOSIS — G9340 Encephalopathy, unspecified: Secondary | ICD-10-CM | POA: Diagnosis not present

## 2017-10-08 DIAGNOSIS — E46 Unspecified protein-calorie malnutrition: Secondary | ICD-10-CM | POA: Diagnosis present

## 2017-10-08 DIAGNOSIS — Z8719 Personal history of other diseases of the digestive system: Secondary | ICD-10-CM | POA: Diagnosis not present

## 2017-10-08 DIAGNOSIS — Z86711 Personal history of pulmonary embolism: Secondary | ICD-10-CM | POA: Diagnosis not present

## 2017-10-08 DIAGNOSIS — Z48815 Encounter for surgical aftercare following surgery on the digestive system: Secondary | ICD-10-CM | POA: Diagnosis not present

## 2017-10-08 DIAGNOSIS — R791 Abnormal coagulation profile: Secondary | ICD-10-CM | POA: Diagnosis not present

## 2017-10-08 DIAGNOSIS — R100 Acute abdomen: Secondary | ICD-10-CM | POA: Diagnosis not present

## 2017-10-08 DIAGNOSIS — R740 Nonspecific elevation of levels of transaminase and lactic acid dehydrogenase [LDH]: Secondary | ICD-10-CM | POA: Diagnosis not present

## 2017-10-08 DIAGNOSIS — K219 Gastro-esophageal reflux disease without esophagitis: Secondary | ICD-10-CM | POA: Diagnosis not present

## 2017-10-08 DIAGNOSIS — N183 Chronic kidney disease, stage 3 (moderate): Secondary | ICD-10-CM | POA: Diagnosis not present

## 2017-10-08 DIAGNOSIS — I361 Nonrheumatic tricuspid (valve) insufficiency: Secondary | ICD-10-CM | POA: Diagnosis not present

## 2017-10-08 DIAGNOSIS — I482 Chronic atrial fibrillation: Secondary | ICD-10-CM | POA: Diagnosis present

## 2017-10-11 DIAGNOSIS — R100 Acute abdomen: Secondary | ICD-10-CM | POA: Diagnosis not present

## 2017-10-11 DIAGNOSIS — I469 Cardiac arrest, cause unspecified: Secondary | ICD-10-CM | POA: Diagnosis not present

## 2017-10-11 DIAGNOSIS — I5021 Acute systolic (congestive) heart failure: Secondary | ICD-10-CM | POA: Diagnosis not present

## 2017-10-11 DIAGNOSIS — G9340 Encephalopathy, unspecified: Secondary | ICD-10-CM | POA: Diagnosis not present

## 2017-10-11 DIAGNOSIS — E039 Hypothyroidism, unspecified: Secondary | ICD-10-CM | POA: Diagnosis not present

## 2017-10-11 DIAGNOSIS — K430 Incisional hernia with obstruction, without gangrene: Secondary | ICD-10-CM | POA: Diagnosis not present

## 2017-10-11 DIAGNOSIS — N189 Chronic kidney disease, unspecified: Secondary | ICD-10-CM | POA: Diagnosis not present

## 2017-10-11 DIAGNOSIS — Z7901 Long term (current) use of anticoagulants: Secondary | ICD-10-CM | POA: Diagnosis not present

## 2017-10-11 DIAGNOSIS — Z7982 Long term (current) use of aspirin: Secondary | ICD-10-CM | POA: Diagnosis not present

## 2017-10-11 DIAGNOSIS — I130 Hypertensive heart and chronic kidney disease with heart failure and stage 1 through stage 4 chronic kidney disease, or unspecified chronic kidney disease: Secondary | ICD-10-CM | POA: Diagnosis not present

## 2017-10-11 DIAGNOSIS — K219 Gastro-esophageal reflux disease without esophagitis: Secondary | ICD-10-CM | POA: Diagnosis not present

## 2017-10-11 DIAGNOSIS — Z8719 Personal history of other diseases of the digestive system: Secondary | ICD-10-CM | POA: Diagnosis not present

## 2017-10-11 DIAGNOSIS — R42 Dizziness and giddiness: Secondary | ICD-10-CM | POA: Diagnosis not present

## 2017-10-11 DIAGNOSIS — I4891 Unspecified atrial fibrillation: Secondary | ICD-10-CM | POA: Diagnosis not present

## 2017-10-11 DIAGNOSIS — Z48815 Encounter for surgical aftercare following surgery on the digestive system: Secondary | ICD-10-CM | POA: Diagnosis not present

## 2017-10-11 DIAGNOSIS — R0902 Hypoxemia: Secondary | ICD-10-CM | POA: Diagnosis not present

## 2017-10-11 DIAGNOSIS — I739 Peripheral vascular disease, unspecified: Secondary | ICD-10-CM | POA: Diagnosis not present

## 2017-10-11 DIAGNOSIS — D72829 Elevated white blood cell count, unspecified: Secondary | ICD-10-CM | POA: Diagnosis not present

## 2017-10-11 DIAGNOSIS — K5649 Other impaction of intestine: Secondary | ICD-10-CM | POA: Diagnosis not present

## 2017-10-24 DIAGNOSIS — I509 Heart failure, unspecified: Secondary | ICD-10-CM | POA: Diagnosis not present

## 2017-10-24 DIAGNOSIS — K219 Gastro-esophageal reflux disease without esophagitis: Secondary | ICD-10-CM | POA: Diagnosis not present

## 2017-10-24 DIAGNOSIS — N183 Chronic kidney disease, stage 3 (moderate): Secondary | ICD-10-CM | POA: Diagnosis not present

## 2017-10-24 DIAGNOSIS — R29898 Other symptoms and signs involving the musculoskeletal system: Secondary | ICD-10-CM | POA: Diagnosis not present

## 2017-10-24 DIAGNOSIS — I4891 Unspecified atrial fibrillation: Secondary | ICD-10-CM | POA: Diagnosis not present

## 2017-10-24 DIAGNOSIS — M2550 Pain in unspecified joint: Secondary | ICD-10-CM | POA: Diagnosis not present

## 2017-10-24 DIAGNOSIS — E785 Hyperlipidemia, unspecified: Secondary | ICD-10-CM | POA: Diagnosis not present

## 2017-10-24 DIAGNOSIS — Z4889 Encounter for other specified surgical aftercare: Secondary | ICD-10-CM | POA: Diagnosis not present

## 2017-11-14 DIAGNOSIS — Z7901 Long term (current) use of anticoagulants: Secondary | ICD-10-CM | POA: Diagnosis not present

## 2017-11-14 DIAGNOSIS — I4891 Unspecified atrial fibrillation: Secondary | ICD-10-CM | POA: Diagnosis not present

## 2017-11-22 DIAGNOSIS — I1310 Hypertensive heart and chronic kidney disease without heart failure, with stage 1 through stage 4 chronic kidney disease, or unspecified chronic kidney disease: Secondary | ICD-10-CM | POA: Diagnosis not present

## 2017-11-22 DIAGNOSIS — K219 Gastro-esophageal reflux disease without esophagitis: Secondary | ICD-10-CM | POA: Diagnosis not present

## 2017-11-22 DIAGNOSIS — M2550 Pain in unspecified joint: Secondary | ICD-10-CM | POA: Diagnosis not present

## 2017-11-22 DIAGNOSIS — E785 Hyperlipidemia, unspecified: Secondary | ICD-10-CM | POA: Diagnosis not present

## 2018-05-16 DIAGNOSIS — E039 Hypothyroidism, unspecified: Secondary | ICD-10-CM | POA: Diagnosis not present

## 2018-05-16 DIAGNOSIS — E038 Other specified hypothyroidism: Secondary | ICD-10-CM | POA: Diagnosis not present

## 2019-02-25 DEATH — deceased
